# Patient Record
Sex: Female | Born: 2000 | Hispanic: No | Marital: Single | Smoking: Former smoker
Health system: Southern US, Community
[De-identification: ages and names within clinical notes are randomized; demographics above are authoritative.]

## PROBLEM LIST (undated history)

## (undated) DIAGNOSIS — G40909 Epilepsy, unspecified, not intractable, without status epilepticus: Secondary | ICD-10-CM

## (undated) DIAGNOSIS — Z789 Other specified health status: Secondary | ICD-10-CM

## (undated) HISTORY — PX: NO PAST SURGERIES: SHX2092

---
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
## 2022-01-11 ENCOUNTER — Encounter: Payer: Self-pay | Admitting: *Deleted

## 2022-01-12 ENCOUNTER — Other Ambulatory Visit: Payer: Self-pay | Admitting: Neurology

## 2022-01-17 ENCOUNTER — Telehealth: Payer: Self-pay

## 2022-01-17 NOTE — Telephone Encounter (Signed)
Patient's mother left a voicemail today at 12:08 PM asking for a call to discuss her daughter.  She left no further information.

## 2022-01-17 NOTE — Telephone Encounter (Signed)
Breakthrough seizure likely related to missed dose of medication. Will follow up on the 21

## 2022-01-17 NOTE — Telephone Encounter (Signed)
I called the patient's mother back. Left message to continue titration as planned and to be careful not to miss any doses. Keep pending appt 01/26/22. Provided our number to call back for any other questions.

## 2022-01-17 NOTE — Telephone Encounter (Signed)
I spoke to her mother. Reports a breakthrough seizure on 01/14/22. She is taking levetiracetam 500mg  BID. She is also titrating her lamotrigine dosage. He mother is unsure if she is at 50mg  BID or 75 BID. She will confirm then call us back. States she was sick with an upper respiratory infection last week and had several missed doses. Pending appt 01/26/22.  They will call back with the medication update.

## 2022-01-26 ENCOUNTER — Encounter: Payer: Self-pay | Admitting: Neurology

## 2022-01-26 ENCOUNTER — Ambulatory Visit: Payer: BC Managed Care – PPO | Admitting: Neurology

## 2022-01-26 VITALS — BP 110/70 | HR 66 | Ht 61.0 in | Wt 103.0 lb

## 2022-01-26 DIAGNOSIS — G40909 Epilepsy, unspecified, not intractable, without status epilepticus: Secondary | ICD-10-CM | POA: Diagnosis not present

## 2022-01-26 DIAGNOSIS — Z5181 Encounter for therapeutic drug level monitoring: Secondary | ICD-10-CM

## 2022-01-26 MED ORDER — LAMOTRIGINE 100 MG PO TABS: 100.0000 mg | ORAL_TABLET | Freq: Two times a day (BID) | ORAL | 6 refills | Status: DC

## 2022-01-26 NOTE — Patient Instructions (Addendum)
Increase lamotrigine to 100 mg twice daily We will obtain a lamotrigine level with a BMP Follow-up in 6 months or sooner if worse Contact me if you have a breakthrough seizure.

## 2022-01-26 NOTE — Progress Notes (Signed)
[facility]  PATIENT: Michelle Horn DOB: [birth year]  REQUESTING CLINICIAN: Carney Living, * HISTORY FROM: Patient and mother  REASON FOR VISIT: Generalized epilepsy.    HISTORICAL  CHIEF COMPLAINT:  Chief Complaint  Patient presents with   Follow-up    ROOM 15, with mother Taking Keppra 500 daily and Lamictal 25mg  tid, last breakthrough sz 6/9      INTERVAL HISTORY 01/26/2022:  Patient presents today for follow-up, she is accompanied by her mother.  At last visit, plan was to start lamotrigine and to switch patient from Keppra to lamotrigine due to side effect of Keppra.  She reported self discontinued the Keppra, currently is taking lamotrigine 75 mg twice daily.  She reported a breakthrough seizure on June 9 in the setting of missing her medication, she did not contact the office.  Otherwise no other complaint and no other concerns.    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  Patient reports her first seizure happened in fall 2022.  Mom describes her seizures as patient being unresponsive, eyes glazed and followed by a full generalized convulsion with foaming at the mouth and tongue biting.  There was no urinary incontinence.  She presented to the ED after her first seizure, was stable and patient referred to pediatric neurology.  She did follow-up with pediatric neurologist, had a EEG which was normal.  She had a second seizure in December 2022 and at that time she was started on Keppra but was not taking it consistently.  Patient report her last seizure was on October 28, 2021, at that time she was restarted on Keppra 500 mg twice daily.  Currently she is taking 500 mg daily, denies any additional seizures but report side effect of sleepiness, and mood swing.  She denies any family history of seizure, denies any seizure risk  factors. In terms of her headache she does report right-sided headache, couple times a week which photophobia, no nausea no vomiting.  States that Tylenol and ibuprofen helps with her headaches.     Handedness: Right handed   Onset: 11/2020  Seizure Type: Generalized convulsion   Current frequency: A total of 4 generalized seizure, the last seizure being in October 28, 2020  Any injuries from seizures: tongue biting   Seizure risk factors: None reported   Previous ASMs: Levetiracetam 500 mg   Currenty ASMs: Levetiracetam 500 mg daily   ASMs side effects: Sleepy and vomiting  Brain Images: Not done  Previous EEGs: Normal    OTHER MEDICAL CONDITIONS: Migraines   REVIEW OF SYSTEMS: Full 14 system review of systems performed and negative with exception of: as noted in the HPI   ALLERGIES: No Known Allergies  HOME MEDICATIONS: Outpatient Medications Prior to Visit  Medication Sig Dispense Refill   folic acid (FOLVITE) 1 MG tablet Take 1 tablet (1 mg total) by mouth daily. 90 tablet 3   levETIRAcetam (KEPPRA) 500 MG tablet Take 1 tablet (500 mg total) by mouth daily. 30 tablet 1   lamoTRIgine (LAMICTAL) 25 MG tablet WEEK 1: 25MG  (ONE PILL) AT NIGHT , WEEK 2: 25MG  (ONE PILL) TWICE DAILY, WEEK 3: 25MG  (ONE PILL) MORNING AND 50MG  (TWO PILLS) NIGHT, WEEK 4: 50MG  (TWO PILLS) TWICE DAILY, WEEK 5: 75MG  (THREE PILLS) TWICE DAILY, WEEK 6: 100MG  (FOURS PILLS) TWICE DAILY 240 tablet 0   No facility-administered medications prior to visit.  PAST MEDICAL HISTORY: Past Medical History:  Diagnosis Date   COVID-19 affecting pregnancy, antepartum 08/24/2020   Group beta Strep positive 11/17/2020   Iron deficiency anemia of pregnancy 10/23/2020   Medical history non-contributory    Normal labor 11/17/2020   Supervision of normal first teen pregnancy 08/24/2020    PAST SURGICAL HISTORY: Past Surgical History:  Procedure Laterality Date   NO PAST SURGERIES      FAMILY HISTORY: Family  History  Problem Relation Age of Onset   Anxiety disorder Mother    Migraines Sister    Anxiety disorder Sister    Migraines Sister    Migraines Maternal Grandmother    Anxiety disorder Maternal Grandfather     SOCIAL HISTORY: Social History   Socioeconomic History   Marital status: Single    Spouse name: Not on file   Number of children: Not on file   Years of education: Not on file   Highest education level: Not on file  Occupational History   Not on file  Tobacco Use   Smoking status: Every Day    Types: Cigars    Passive exposure: Never   Smokeless tobacco: Never  Substance and Sexual Activity   Alcohol use: Never   Drug use: Never   Sexual activity: Yes    Birth control/protection: None  Other Topics Concern   Not on file  Social History Narrative   Michelle Horn is 21 years old.   Work in [employer].   Considering college when baby is older to communicate   Social Determinants of Health   Financial Resource Strain: Not on file  Food Insecurity: Not on file  Transportation Needs: Not on file  Physical Activity: Not on file  Stress: Not on file  Social Connections: Not on file  Intimate Partner Violence: Not on file    PHYSICAL EXAM  GENERAL EXAM/CONSTITUTIONAL: Vitals:  Vitals:   01/26/22 1452  BP: 110/70  Pulse: 66  Weight: 103 lb (46.7 kg)  Height: 5\' 1"  (1.549 m)   Body mass index is 19.46 kg/m. Wt Readings from Last 3 Encounters:  01/26/22 103 lb (46.7 kg) (7 %, Z= -1.44)*  12/15/21 103 lb (46.7 kg) (8 %, Z= -1.43)*  11/30/21 103 lb (46.7 kg) (8 %, Z= -1.42)*   * Growth percentiles are based on CDC (Girls, 2-20 Years) data.   Patient is in no distress; well developed, nourished and groomed; neck is supple  EYES: Pupils round and reactive to light, Visual fields full to confrontation, Extraocular movements intacts,  No results found.  MUSCULOSKELETAL: Gait, strength, tone, movements noted in Neurologic exam below  NEUROLOGIC: MENTAL  STATUS:      No data to display         awake, alert, oriented to person, place and time recent and remote memory intact normal attention and concentration language fluent, comprehension intact, naming intact fund of knowledge appropriate  CRANIAL NERVE:  2nd, 3rd, 4th, 6th - pupils equal and reactive to light, visual fields full to confrontation, extraocular muscles intact, no nystagmus 5th - facial sensation symmetric 7th - facial strength symmetric 8th - hearing intact 9th - palate elevates symmetrically, uvula midline 11th - shoulder shrug symmetric 12th - tongue protrusion midline  MOTOR:  normal bulk and tone, full strength in the BUE, BLE  SENSORY:  normal and symmetric to light touch, pinprick, temperature, vibration  COORDINATION:  finger-nose-finger, fine finger movements normal  REFLEXES:  deep tendon reflexes present and symmetric  GAIT/STATION:  normal  DIAGNOSTIC DATA (LABS, IMAGING, TESTING) - I reviewed patient records, labs, notes, testing and imaging myself where available.  Lab Results  Component Value Date   WBC 12.3 06/12/2021   HGB 12.5 06/12/2021   HCT 36.3 06/12/2021   MCV 88.5 06/12/2021   PLT 350 06/12/2021      Component Value Date/Time   NA 140 06/12/2021 2030   NA 142 02/25/2020 0941   K 3.8 06/12/2021 2030   CL 109 06/12/2021 2030   CO2 21 (L) 06/12/2021 2030   GLUCOSE 87 06/12/2021 2030   BUN 7 06/12/2021 2030   BUN 14 02/25/2020 0941   CREATININE 0.74 06/12/2021 2030   CALCIUM 9.7 06/12/2021 2030   PROT 7.2 06/12/2021 2030   PROT 7.1 02/25/2020 0941   ALBUMIN 4.6 06/12/2021 2030   ALBUMIN 4.8 02/25/2020 0941   AST 18 06/12/2021 2030   ALT 14 06/12/2021 2030   ALKPHOS 57 06/12/2021 2030   BILITOT 1.0 06/12/2021 2030   BILITOT <0.2 02/25/2020 0941   GFRNONAA NOT CALCULATED 06/12/2021 2030   GFRAA CANCELED 02/25/2020 0941   No results found for: "CHOL", "HDL", "LDLCALC", "LDLDIRECT", "TRIG" No results found for:  "HGBA1C" No results found for: "VITAMINB12" Lab Results  Component Value Date   TSH 0.833 02/25/2020    Ambulatory EEG 08/2021 This prolonged ambulatory video EEG for 45 hours is normal with no epileptiform discharges or seizure activity.  There were no transient rhythmic activities or electrographic seizures noted.  Although there were frequent muscle artifact as well as electrode artifacts noted throughout the recording particularly the second part of the recording. There were no clinical seizure activity or pushbutton events noted or reported.  Background activity was normal.  Please note that a normal EEG does not exclude epilepsy, clinical correlation is indicated.   MRI Brain 12/29/2021:  Unremarkable MRI scan of the brain with and without contrast   ASSESSMENT AND PLAN  21 y.o. year old female  with migraine headache and seizure disorder who is presenting for follow up for her seizures.  Patient had a total of 4 generalized tonic-clonic seizures, her last one being on January 14 2022 in the setting of missing her medication.  Since she already self discontinued her Keppra, I will advise patient to go ahead and increase the lamotrigine to 100 mg twice daily.  I will obtain a lamotrigine level today with a BMP.  Advised her to contact me if she has a breakthrough seizure otherwise I will see her in 3 months for follow-up.  She reports she is comfortable with plans.  Discussed driving restriction for the next 6 months.    1. Seizure disorder (HCC)   2. Therapeutic drug monitoring      Patient Instructions  Increase lamotrigine to 100 mg twice daily We will obtain a lamotrigine level with a BMP Follow-up in 6 months or sooner if worse Contact me if you have a breakthrough seizure.   Per [facility] statutes, patients with seizures are not allowed to drive until they have been seizure-free for six months.  Other recommendations include using caution when using heavy equipment or  power tools. Avoid working on ladders or at heights. Take showers instead of baths.  Do not swim alone.  Ensure the water temperature is not too high on the home water heater. Do not go swimming alone. Do not lock yourself in a room alone (i.e. bathroom). When caring for infants or small children, sit down when holding, feeding, or changing  them to minimize risk of injury to the child in the event you have a seizure. Maintain good sleep hygiene. Avoid alcohol.  Also recommend adequate sleep, hydration, good diet and minimize stress.   During the Seizure  - First, ensure adequate ventilation and place patients on the floor on their left side  Loosen clothing around the neck and ensure the airway is patent. If the patient is clenching the teeth, do not force the mouth open with any object as this can cause severe damage - Remove all items from the surrounding that can be hazardous. The patient may be oblivious to what's happening and may not even know what he or she is doing. If the patient is confused and wandering, either gently guide him/her away and block access to outside areas - Reassure the individual and be comforting - Call 911. In most cases, the seizure ends before EMS arrives. However, there are cases when seizures may last over 3 to 5 minutes. Or the individual may have developed breathing difficulties or severe injuries. If a pregnant patient or a person with diabetes develops a seizure, it is prudent to call an ambulance. - Finally, if the patient does not regain full consciousness, then call EMS. Most patients will remain confused for about 45 to 90 minutes after a seizure, so you must use judgment in calling for help. - Avoid restraints but make sure the patient is in a bed with padded side rails - Place the individual in a lateral position with the neck slightly flexed; this will help the saliva drain from the mouth and prevent the tongue from falling backward - Remove all nearby  furniture and other hazards from the area - Provide verbal assurance as the individual is regaining consciousness - Provide the patient with privacy if possible - Call for help and start treatment as ordered by the caregiver   After the Seizure (Postictal Stage)  After a seizure, most patients experience confusion, fatigue, muscle pain and/or a headache. Thus, one should permit the individual to sleep. For the next few days, reassurance is essential. Being calm and helping reorient the person is also of importance.  Most seizures are painless and end spontaneously. Seizures are not harmful to others but can lead to complications such as stress on the lungs, brain and the heart. Individuals with prior lung problems may develop labored breathing and respiratory distress.     Orders Placed This Encounter  Procedures   Lamotrigine level   Basic Metabolic Panel    Meds ordered this encounter  Medications   lamoTRIgine (LAMICTAL) 100 MG tablet    Sig: Take 1 tablet (100 mg total) by mouth 2 (two) times daily.    Dispense:  60 tablet    Refill:  6    Return in about 6 months (around 07/28/2022).    Windell Norfolk, MD 01/26/2022, 5:09 PM  [facility] [address], [address] [place], [place] [address] [phone]

## 2022-01-28 LAB — LAMOTRIGINE LEVEL: Lamotrigine Lvl: 3.1 ug/mL (ref 2.0–20.0)

## 2022-01-28 LAB — BASIC METABOLIC PANEL
BUN/Creatinine Ratio: 15 (ref 9–23)
BUN: 13 mg/dL (ref 6–20)
CO2: 25 mmol/L (ref 20–29)
Calcium: 10.5 mg/dL — ABNORMAL HIGH (ref 8.7–10.2)
Chloride: 103 mmol/L (ref 96–106)
Creatinine, Ser: 0.84 mg/dL (ref 0.57–1.00)
Glucose: 83 mg/dL (ref 70–99)
Potassium: 4.5 mmol/L (ref 3.5–5.2)
Sodium: 143 mmol/L (ref 134–144)
eGFR: 103 mL/min/{1.73_m2} (ref >59)

## 2022-02-07 ENCOUNTER — Encounter: Payer: Self-pay | Admitting: Family Medicine

## 2022-02-07 ENCOUNTER — Ambulatory Visit (INDEPENDENT_AMBULATORY_CARE_PROVIDER_SITE_OTHER): Payer: BC Managed Care – PPO | Admitting: Family Medicine

## 2022-02-07 ENCOUNTER — Other Ambulatory Visit (HOSPITAL_COMMUNITY)
Admission: RE | Admit: 2022-02-07 | Discharge: 2022-02-07 | Disposition: A | Discharge status: Home / Self Care | Payer: BC Managed Care – PPO | Source: Ambulatory Visit | Attending: Family Medicine | Admitting: Family Medicine

## 2022-02-07 VITALS — BP 100/60 | HR 95 | Temp 98.8°F | Wt 101.6 lb

## 2022-02-07 DIAGNOSIS — N898 Other specified noninflammatory disorders of vagina: Secondary | ICD-10-CM | POA: Diagnosis not present

## 2022-02-07 DIAGNOSIS — R300 Dysuria: Secondary | ICD-10-CM

## 2022-02-07 DIAGNOSIS — N760 Acute vaginitis: Secondary | ICD-10-CM | POA: Diagnosis not present

## 2022-02-07 DIAGNOSIS — R102 Pelvic and perineal pain: Secondary | ICD-10-CM | POA: Diagnosis not present

## 2022-02-07 LAB — POCT URINALYSIS DIP (MANUAL ENTRY)
Glucose, UA: NEGATIVE mg/dL
Nitrite, UA: NEGATIVE
Protein Ur, POC: >=300 mg/dL — AB
Spec Grav, UA: >=1.03 — AB (ref 1.010–1.025)
Urobilinogen, UA: 0.2 E.U./dL
pH, UA: 6.5 (ref 5.0–8.0)

## 2022-02-07 MED ORDER — ACYCLOVIR 800 MG PO TABS: 800.0000 mg | ORAL_TABLET | Freq: Two times a day (BID) | ORAL | 0 refills | Status: AC

## 2022-02-07 NOTE — Assessment & Plan Note (Signed)
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

## 2022-02-07 NOTE — Progress Notes (Signed)
    SUBJECTIVE:   CHIEF COMPLAINT / HPI: dysuria and vaginal discharge  Patient reports that after having intercourse 4 days ago, three days ago she began to have pelvic pain and dysuria  She denies pain with intercourse at the time She reports not using barrier contraception  Vaginal discharge is white/yellow  She denies pruritis  She denies lower abdominal pain  She denies fevers  She denies back pain  Patient reports burning with urination  Patient reports that last sexual encounter was rougher than normal  PERTINENT  PMH / PSH:  Depression    OBJECTIVE:   BP 100/60   Pulse 95   Temp 98.8 F (37.1 C)   Wt 101 lb 9.6 oz (46.1 kg)   SpO2 99%   BMI 19.20 kg/m   Physical Exam Abdominal:     General: Bowel sounds are normal.     Palpations: Abdomen is soft. There is no hepatomegaly, splenomegaly, mass or pulsatile mass.     Tenderness: There is no abdominal tenderness. There is no right CVA tenderness or left CVA tenderness.  Genitourinary:    Labia:        Right: Tenderness and lesion present.        Left: Tenderness and lesion present.         ASSESSMENT/PLAN:   Vaginal lesion Bilateral, tender ulcerative lesions on labia minora suspicious for HSV Collected viral swab  Given patient's tenderness recommended trial of acyclovir for 5 day course 800mg  BID  Also recommended dermoplast spray to help with irritation with urinating   Unable to complete internal pelvic exam due to patient's inability to tolerate speculum insertion, collected aptima swab with blind insertion to avoid irritating areas of lesions      , MD [facility]

## 2022-02-07 NOTE — Patient Instructions (Addendum)
  We will test for any infections or changes to your vaginal pH that may be causing the discharge as well as check for yeast.   We will collect swabs to test for sexually transmitted infections. I will notify you of any abnormal results.   I have prescribed a medication to help with the sore areas we discussed today. Please take this twice daily for 5 days.

## 2022-02-10 ENCOUNTER — Telehealth: Payer: Self-pay

## 2022-02-10 LAB — CERVICOVAGINAL ANCILLARY ONLY
Bacterial Vaginitis (gardnerella): POSITIVE — AB
Candida Glabrata: NEGATIVE
Candida Vaginitis: NEGATIVE
Chlamydia: NEGATIVE
Comment: NEGATIVE
Comment: NEGATIVE
Comment: NEGATIVE
Comment: NEGATIVE
Comment: NEGATIVE
Comment: NORMAL
Neisseria Gonorrhea: NEGATIVE
Trichomonas: NEGATIVE

## 2022-02-10 LAB — HERPES SIMPLEX VIRUS CULTURE

## 2022-02-10 MED ORDER — METRONIDAZOLE 500 MG PO TABS: 500.0000 mg | ORAL_TABLET | Freq: Three times a day (TID) | ORAL | 0 refills | Status: DC

## 2022-02-10 NOTE — Telephone Encounter (Signed)
Patient and mother call nurse line requesting results from recent visit.   Mother reports her symptoms have improved. Denies any continued dysuria. Patient has been taking acyclovir, however herpes simplex was negative.   Mother advised of GC/Chlamydia still pending.   Mother would like to know the results of urine.   Will forward to provider who saw patient.

## 2022-02-10 NOTE — Addendum Note (Signed)
Addended by: Bing Neighbors on: 02/10/2022 01:34 PM   Modules accepted: Orders

## 2022-02-11 NOTE — Telephone Encounter (Signed)
Please see result note.  Called to discuss results with patient's mother and patient.

## 2022-02-18 ENCOUNTER — Other Ambulatory Visit: Payer: Self-pay | Admitting: Neurology

## 2022-03-27 ENCOUNTER — Ambulatory Visit (HOSPITAL_COMMUNITY)
Admission: EM | Admit: 2022-03-27 | Discharge: 2022-03-27 | Disposition: A | Discharge status: Home / Self Care | Payer: BC Managed Care – PPO | Attending: Physician Assistant | Admitting: Physician Assistant

## 2022-03-27 ENCOUNTER — Encounter (HOSPITAL_COMMUNITY): Payer: Self-pay | Admitting: Emergency Medicine

## 2022-03-27 DIAGNOSIS — J069 Acute upper respiratory infection, unspecified: Secondary | ICD-10-CM | POA: Insufficient documentation

## 2022-03-27 DIAGNOSIS — R112 Nausea with vomiting, unspecified: Secondary | ICD-10-CM | POA: Diagnosis not present

## 2022-03-27 DIAGNOSIS — R509 Fever, unspecified: Secondary | ICD-10-CM | POA: Diagnosis not present

## 2022-03-27 DIAGNOSIS — R300 Dysuria: Secondary | ICD-10-CM | POA: Diagnosis not present

## 2022-03-27 DIAGNOSIS — Z20822 Contact with and (suspected) exposure to covid-19: Secondary | ICD-10-CM | POA: Insufficient documentation

## 2022-03-27 DIAGNOSIS — Z79899 Other long term (current) drug therapy: Secondary | ICD-10-CM | POA: Diagnosis not present

## 2022-03-27 HISTORY — DX: Epilepsy, unspecified, not intractable, without status epilepticus: G40.909

## 2022-03-27 LAB — POC URINE PREG, ED: Preg Test, Ur: NEGATIVE

## 2022-03-27 LAB — CBC WITH DIFFERENTIAL/PLATELET
Abs Immature Granulocytes: 0.11 10*3/uL — ABNORMAL HIGH (ref 0.00–0.07)
Basophils Absolute: 0.1 10*3/uL (ref 0.0–0.1)
Basophils Relative: 0 %
Eosinophils Absolute: 0.5 10*3/uL (ref 0.0–0.5)
Eosinophils Relative: 3 %
HCT: 41.4 % (ref 36.0–46.0)
Hemoglobin: 14 g/dL (ref 12.0–15.0)
Immature Granulocytes: 1 %
Lymphocytes Relative: 6 %
Lymphs Abs: 1 10*3/uL (ref 0.7–4.0)
MCH: 29.4 pg (ref 26.0–34.0)
MCHC: 33.8 g/dL (ref 30.0–36.0)
MCV: 87 fL (ref 80.0–100.0)
Monocytes Absolute: 2.3 10*3/uL — ABNORMAL HIGH (ref 0.1–1.0)
Monocytes Relative: 12 %
Neutro Abs: 14.7 10*3/uL — ABNORMAL HIGH (ref 1.7–7.7)
Neutrophils Relative %: 78 %
Platelets: 289 10*3/uL (ref 150–400)
RBC: 4.76 MIL/uL (ref 3.87–5.11)
RDW: 13.5 % (ref 11.5–15.5)
WBC: 18.6 10*3/uL — ABNORMAL HIGH (ref 4.0–10.5)
nRBC: 0 % (ref 0.0–0.2)

## 2022-03-27 LAB — POCT URINALYSIS DIPSTICK, ED / UC
Bilirubin Urine: NEGATIVE
Glucose, UA: NEGATIVE mg/dL
Ketones, ur: 15 mg/dL — AB
Nitrite: NEGATIVE
Protein, ur: >=300 mg/dL — AB
Specific Gravity, Urine: 1.015 (ref 1.005–1.030)
Urobilinogen, UA: 2 mg/dL — ABNORMAL HIGH (ref 0.0–1.0)
pH: 6.5 (ref 5.0–8.0)

## 2022-03-27 LAB — RESP PANEL BY RT-PCR (FLU A&B, COVID) ARPGX2
Influenza A by PCR: NEGATIVE
Influenza B by PCR: NEGATIVE
SARS Coronavirus 2 by RT PCR: NEGATIVE

## 2022-03-27 MED ORDER — ACETAMINOPHEN 325 MG PO TABS
650.0000 mg | ORAL_TABLET | Freq: Once | ORAL | Status: AC
  Administered 2022-03-27: 650 mg via ORAL

## 2022-03-27 MED ORDER — NITROFURANTOIN MONOHYD MACRO 100 MG PO CAPS: 100.0000 mg | ORAL_CAPSULE | Freq: Two times a day (BID) | ORAL | 0 refills | Status: DC

## 2022-03-27 MED ORDER — ACETAMINOPHEN 325 MG PO TABS
ORAL_TABLET | ORAL | Status: AC
  Filled 2022-03-27: qty 2

## 2022-03-27 MED ORDER — ONDANSETRON 4 MG PO TBDP
ORAL_TABLET | ORAL | Status: AC
  Filled 2022-03-27: qty 1

## 2022-03-27 MED ORDER — ONDANSETRON HCL 4 MG PO TABS: 4.0000 mg | ORAL_TABLET | Freq: Three times a day (TID) | ORAL | 0 refills | Status: DC | PRN

## 2022-03-27 MED ORDER — ONDANSETRON 4 MG PO TBDP
4.0000 mg | ORAL_TABLET | Freq: Once | ORAL | Status: AC
  Administered 2022-03-27: 4 mg via ORAL

## 2022-03-27 NOTE — Discharge Instructions (Addendum)
Advised to take the Zofran 1 tablet every 6 hours to control the abdominal cramping nausea and vomiting. Advised to increase fluid intake-clear liquids-Sprite-ginger ale-7-Up-10K-Gatorade.  Advised to then move into a bland diet over the next 48 hours if tolerated. Lab test for COVID and flu should be completed in the next 48 hours.  If this office does not call then consider these test negative.  You can go on MyChart to view the test results that 24 to 48 hours. Advised to report to the emergency room if symptoms fail to improve in the next 48 hours.

## 2022-03-27 NOTE — ED Provider Notes (Signed)
[facility]    CSN: [id number] Arrival date & time: 03/27/22  1026      History   Chief Complaint Chief Complaint  Patient presents with   Emesis   Fever   Generalized Body Aches    HPI Michelle Horn is a 21 y.o. female.   21 year old female presents with nausea and vomiting.  Patient indicates that since Thursday she has been having fever, 100-101.  She also has chills, body aches and pain, mild upper respiratory symptoms of rhinitis and congestion.  She also indicates that Thursday she started having stomach cramping, nausea and vomiting, without diarrhea.  Patient also indicates that on Tuesday and Wednesday she started having frequency, urgency and intermittent dysuria.  Patient also indicates that she is having some lower back pain associated with her symptoms.  She indicates she is tolerating fluids, and that the last time she urinated was this morning and it was light yellow.  Patient indicates that she has not been around any family or friends that have been sick with similar type symptoms.  Patient has not traveled recently.   Emesis Associated symptoms: fever   Fever Associated symptoms: nausea, rhinorrhea and vomiting     Past Medical History:  Diagnosis Date   COVID-19 affecting pregnancy, antepartum 08/24/2020   Epilepsy (HCC)    Group beta Strep positive 11/17/2020   Iron deficiency anemia of pregnancy 10/23/2020   Medical history non-contributory    Normal labor 11/17/2020   Supervision of normal first teen pregnancy 08/24/2020    Patient Active Problem List   Diagnosis Date Noted   Vaginal lesion 02/07/2022   Encounter for initial prescription of contraceptives 08/03/2021   First time seizure (HCC) 07/14/2021   Breakthrough bleeding on Depo-Provera 03/10/2021   Marijuana use 09/15/2020   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 Authorizing Provider  nitrofurantoin, macrocrystal-monohydrate, (MACROBID) 100 MG capsule Take 1 capsule (100 mg total) by mouth 2 (two) times daily. 03/27/22  Yes Ellsworth Lennox, PA-C  ondansetron (ZOFRAN) 4 MG tablet Take 1 tablet (4 mg total) by mouth every 8 (eight) hours as needed for nausea or vomiting. 03/27/22  Yes Ellsworth Lennox, PA-C  folic acid (FOLVITE) 1 MG tablet Take 1 tablet (1 mg total) by mouth daily. 12/15/21 12/10/22  Windell Norfolk, MD  lamoTRIgine (LAMICTAL) 100 MG tablet Take 1 tablet (100 mg total) by mouth 2 (two) times daily. 01/26/22 08/24/22  Windell Norfolk, MD  levETIRAcetam (KEPPRA) 500 MG tablet Take 1 tablet (500 mg total) by mouth daily. 12/15/21 02/13/22  Windell Norfolk, MD  metroNIDAZOLE (FLAGYL) 500 MG tablet Take 1 tablet (500 mg total) by mouth 3 (three) times daily. 02/10/22   Simmons-Robinson, Tawanna Cooler, MD    Family History Family History  Problem Relation Age of Onset   Anxiety disorder Mother  Migraines Sister    Anxiety disorder Sister    Migraines Sister    Migraines Maternal Grandmother    Anxiety disorder Maternal Grandfather     Social History Social History   Tobacco Use   Smoking status: Every Day    Types: Cigars    Passive exposure: Never   Smokeless tobacco: Never  Substance Use Topics   Alcohol use: Never   Drug use: Never     Allergies   Patient has no known allergies.   Review of Systems Review of Systems  Constitutional:  Positive for fatigue and fever.  HENT:  Positive for rhinorrhea.   Gastrointestinal:  Positive for nausea and vomiting.     Physical Exam Triage Vital Signs ED Triage Vitals  Enc Vitals Group     BP 03/27/22 1108 119/83     Pulse Rate  03/27/22 1108 (!) 110     Resp 03/27/22 1108 15     Temp 03/27/22 1108 (!) 101.3 F (38.5 C)     Temp Source 03/27/22 1108 Oral     SpO2 03/27/22 1108 98 %     Weight --      Height --      Head Circumference --      Peak Flow --      Pain Score 03/27/22 1102 8     Pain Loc --      Pain Edu? --      Excl. in GC? --    No data found.  Updated Vital Signs BP 119/83 (BP Location: Left Arm)   Pulse (!) 110   Temp (!) 101.3 F (38.5 C) (Oral)   Resp 15   SpO2 98%   Visual Acuity Right Eye Distance:   Left Eye Distance:   Bilateral Distance:    Right Eye Near:   Left Eye Near:    Bilateral Near:     Physical Exam Constitutional:      Appearance: Normal appearance.  HENT:     Right Ear: Tympanic membrane normal.     Left Ear: Tympanic membrane normal.     Mouth/Throat:     Mouth: Mucous membranes are moist.     Pharynx: Oropharynx is clear. No pharyngeal swelling.  Cardiovascular:     Rate and Rhythm: Normal rate and regular rhythm.     Heart sounds: Normal heart sounds.  Pulmonary:     Effort: Pulmonary effort is normal.     Breath sounds: Normal breath sounds and air entry. No wheezing, rhonchi or rales.  Abdominal:     General: Abdomen is flat. Bowel sounds are normal.     Palpations: Abdomen is soft.     Tenderness: There is generalized abdominal tenderness. There is no right CVA tenderness, left CVA tenderness, guarding or rebound.     Comments: Back: No CVA tenderness present bilaterally however there is some mild mid lower back tenderness on palpation.  Lymphadenopathy:     Cervical: No cervical adenopathy.  Neurological:     Mental Status: She is alert.      UC Treatments / Results  Labs (all labs ordered are listed, but only abnormal results are displayed) Labs Reviewed  POCT URINALYSIS DIPSTICK, ED / UC - Abnormal; Notable for the following components:      Result Value   Ketones, ur 15 (*)    Hgb urine dipstick LARGE (*)    Protein, ur >=300  (*)    Urobilinogen, UA 2.0 (*)    Leukocytes,Ua MODERATE (*)  All other components within normal limits  RESP PANEL BY RT-PCR (FLU A&B, COVID) ARPGX2  URINE CULTURE  CBC WITH DIFFERENTIAL/PLATELET  POC URINE PREG, ED    EKG   Radiology No results found.  Procedures Procedures (including critical care time)  Medications Ordered in UC Medications  ondansetron (ZOFRAN-ODT) disintegrating tablet 4 mg (4 mg Oral Given 03/27/22 1147)  acetaminophen (TYLENOL) tablet 650 mg (650 mg Oral Given 03/27/22 1205)    Initial Impression / Assessment and Plan / UC Course  I have reviewed the triage vital signs and the nursing notes.  Pertinent labs & imaging results that were available during my care of the patient were reviewed by me and considered in my medical decision making (see chart for details).    Plan: 1.  COVID and flu test are pending. 2.  CBC is pending. 3.  Advised patient to increase her fluid intake and to take the Zofran 1 every 6 hours in order to decrease the stomach cramping and vomiting. 4.  Urine culture is pending. 5.  Advised to take the Macrobid 1 every 12 hours until completed. 6.  Advised to report to the emergency room if symptoms fail to improve within the next 48 to 72 hours. Final Clinical Impressions(s) / UC Diagnoses   Final diagnoses:  Fever, unspecified  Nausea and vomiting, unspecified vomiting type  Dysuria  Viral upper respiratory tract infection     Discharge Instructions      Advised to take the Zofran 1 tablet every 6 hours to control the abdominal cramping nausea and vomiting. Advised to increase fluid intake-clear liquids-Sprite-ginger ale-7-Up-10K-Gatorade.  Advised to then move into a bland diet over the next 48 hours if tolerated. Lab test for COVID and flu should be completed in the next 48 hours.  If this office does not call then consider these test negative.  You can go on MyChart to view the test results that 24 to 48  hours. Advised to report to the emergency room if symptoms fail to improve in the next 48 hours.    ED Prescriptions     Medication Sig Dispense Auth. Provider   ondansetron (ZOFRAN) 4 MG tablet Take 1 tablet (4 mg total) by mouth every 8 (eight) hours as needed for nausea or vomiting. 20 tablet Ellsworth Lennox, PA-C   nitrofurantoin, macrocrystal-monohydrate, (MACROBID) 100 MG capsule Take 1 capsule (100 mg total) by mouth 2 (two) times daily. 10 capsule Ellsworth Lennox, PA-C      PDMP not reviewed this encounter.   Ellsworth Lennox, PA-C 03/27/22 1211

## 2022-03-27 NOTE — ED Triage Notes (Signed)
Since Thursday having vomiting, body aches, fevers. Dramamine, ibuprofen

## 2022-03-29 ENCOUNTER — Telehealth: Payer: Self-pay

## 2022-03-29 LAB — URINE CULTURE: Culture: >=100000 — AB

## 2022-03-29 NOTE — Telephone Encounter (Signed)
Mother calls nurse line reporting continued fevers and vomiting.   Mother reports she went to UC on Sunday and was given Macrobid and Zofran, however no relief.   Mother reports most recent temp was ~2am this morning and 102.7. Mother reports they have been alternating Tylenol and Motrin. Mother reports she can not keep anything down and she is very weak.   Mother advised to take her to ED for evaluation.   Mother agreed with plan.

## 2022-04-29 ENCOUNTER — Other Ambulatory Visit: Payer: Self-pay | Admitting: Neurology

## 2022-07-24 ENCOUNTER — Other Ambulatory Visit: Payer: Self-pay | Admitting: Neurology

## 2022-07-28 ENCOUNTER — Ambulatory Visit: Payer: BC Managed Care – PPO | Admitting: Neurology

## 2022-10-07 ENCOUNTER — Emergency Department (HOSPITAL_COMMUNITY)
Admission: EM | Admit: 2022-10-07 | Discharge: 2022-10-07 | Disposition: A | Discharge status: Home / Self Care | Payer: BC Managed Care – PPO | Attending: Emergency Medicine | Admitting: Emergency Medicine

## 2022-10-07 ENCOUNTER — Other Ambulatory Visit: Payer: Self-pay

## 2022-10-07 DIAGNOSIS — R569 Unspecified convulsions: Secondary | ICD-10-CM

## 2022-10-07 DIAGNOSIS — G40909 Epilepsy, unspecified, not intractable, without status epilepticus: Secondary | ICD-10-CM | POA: Diagnosis not present

## 2022-10-07 DIAGNOSIS — R1111 Vomiting without nausea: Secondary | ICD-10-CM | POA: Diagnosis not present

## 2022-10-07 DIAGNOSIS — R519 Headache, unspecified: Secondary | ICD-10-CM | POA: Insufficient documentation

## 2022-10-07 DIAGNOSIS — R456 Violent behavior: Secondary | ICD-10-CM | POA: Diagnosis not present

## 2022-10-07 DIAGNOSIS — I959 Hypotension, unspecified: Secondary | ICD-10-CM | POA: Diagnosis not present

## 2022-10-07 LAB — CBC
HCT: 48.5 % — ABNORMAL HIGH (ref 36.0–46.0)
Hemoglobin: 15.8 g/dL — ABNORMAL HIGH (ref 12.0–15.0)
MCH: 29.1 pg (ref 26.0–34.0)
MCHC: 32.6 g/dL (ref 30.0–36.0)
MCV: 89.3 fL (ref 80.0–100.0)
Platelets: 378 10*3/uL (ref 150–400)
RBC: 5.43 MIL/uL — ABNORMAL HIGH (ref 3.87–5.11)
RDW: 14.3 % (ref 11.5–15.5)
WBC: 12.2 10*3/uL — ABNORMAL HIGH (ref 4.0–10.5)
nRBC: 0 % (ref 0.0–0.2)

## 2022-10-07 LAB — BASIC METABOLIC PANEL
Anion gap: 8 (ref 5–15)
BUN: 18 mg/dL (ref 6–20)
CO2: 25 mmol/L (ref 22–32)
Calcium: 9.6 mg/dL (ref 8.9–10.3)
Chloride: 104 mmol/L (ref 98–111)
Creatinine, Ser: 0.86 mg/dL (ref 0.44–1.00)
GFR, Estimated: >60 mL/min (ref >60)
Glucose, Bld: 94 mg/dL (ref 70–99)
Potassium: 4 mmol/L (ref 3.5–5.1)
Sodium: 137 mmol/L (ref 135–145)

## 2022-10-07 LAB — I-STAT BETA HCG BLOOD, ED (MC, WL, AP ONLY): I-stat hCG, quantitative: <5 m[IU]/mL (ref <5)

## 2022-10-07 LAB — CBG MONITORING, ED: Glucose-Capillary: 111 mg/dL — ABNORMAL HIGH (ref 70–99)

## 2022-10-07 MED ORDER — ACETAMINOPHEN 325 MG PO TABS
650.0000 mg | ORAL_TABLET | Freq: Once | ORAL | Status: AC
  Administered 2022-10-07: 650 mg via ORAL
  Filled 2022-10-07: qty 2

## 2022-10-07 NOTE — Discharge Instructions (Addendum)
You were seen in the ER after a seizure.  Your lab work was normal. We got a level to look at how well your lamotrigine is doing, and this should result in the next day or so.   Please follow up with your neurologist as they will likely want to make adjustments to your medications.
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
## 2023-02-07 ENCOUNTER — Other Ambulatory Visit: Payer: Self-pay | Admitting: Neurology

## 2023-03-30 NOTE — Progress Notes (Signed)
[facility]  PATIENT: Michelle Horn DOB: 2000/12/18   PRIMARY NEUROLOGIST: Dr. Teresa Horn REQUESTING CLINICIAN: Deirdre Horn Michelle Horn, * HISTORY FROM: Patient and mother  REASON FOR VISIT: Generalized epilepsy.    HISTORICAL  CHIEF COMPLAINT:  Chief Complaint  Patient presents with   Follow-up    Patient in room #3 with her mother. Patient states she has had at ess three episode since the last time she was seen.   HPI:  Update 04/03/2023 JM: Patient returns for follow-up visit after prior visit with Dr. Teresa Horn over 1 year ago.  She is accompanied by her mother.  Reports 3 seizures this year, was seen in ED in March for typical type seizure, continue lamotrigine 100 mg twice daily.  Reports additional seizure in May, evaluated by EMS but did not proceed to ED, typical type seizure but postictal confusion lasted for about 1 hour. Reports seizure 2 weeks ago while on cruise, typical type seizure, "mild" per mother.  Denies any missed dosages of lamotrigine, denies any provoked factors around the time of seizures such as illness/infection, sleep deprivation, drastic change in diet, etc. Reports tolerating lamotrigine dosage well.    History provided for reference purposes only Update 01/26/2022 Dr. Teresa Horn:  Patient presents today for follow-up, she is accompanied by her mother.  At last visit, plan was to start lamotrigine and to switch patient from Keppra to lamotrigine due to side effect of Keppra.  She reported self discontinued the Keppra, currently is taking lamotrigine 75 mg twice daily.  She reported a breakthrough seizure on June 9 in the setting of missing her medication, she did not contact the office.  Otherwise no other complaint and no other concerns.  Consult visit 12/15/2021 Dr. Teresa Horn: This is a 22 year old woman past medical history of migraine headaches who is presenting to establish care for her generalized epilepsy disorder.  Patient reports her first  seizure happened in fall 2022.  Mom describes her seizures as patient being unresponsive, eyes glazed and followed by a full generalized convulsion with foaming at the mouth and tongue biting.  There was no urinary incontinence.  She presented to the ED after her first seizure, was stable and patient referred to pediatric neurology.  She did follow-up with pediatric neurologist, had a EEG which was normal.  She had a second seizure in December 2022 and at that time she was started on Keppra but was not taking it consistently.  Patient report her last seizure was on October 28, 2021, at that time she was restarted on Keppra 500 mg twice daily.  Currently she is taking 500 mg daily, denies any additional seizures but report side effect of sleepiness, and mood swing.  She denies any family history of seizure, denies any seizure risk factors. In terms of her headache she does report right-sided headache, couple times a week which photophobia, no nausea no vomiting.  States that Tylenol and ibuprofen helps with her headaches.     Handedness: Right handed   Onset: 11/2020  Seizure Type: Generalized convulsion   Current frequency: A total of 4 generalized seizure, the last seizure being in October 28, 2020  Any injuries from seizures: tongue biting   Seizure risk factors: None reported   Previous ASMs: Levetiracetam 500 mg   Currenty ASMs: Levetiracetam 500 mg daily   ASMs side effects: Sleepy and vomiting  Brain Images: Not done  Previous EEGs: Normal    OTHER MEDICAL CONDITIONS: Migraines   REVIEW OF SYSTEMS: Full 14 system  review of systems performed and negative with exception of: as noted in the HPI   ALLERGIES: No Known Allergies  HOME MEDICATIONS: Outpatient Medications Prior to Visit  Medication Sig Dispense Refill   ondansetron (ZOFRAN) 4 MG tablet Take 1 tablet (4 mg total) by mouth every 8 (eight) hours as needed for nausea or vomiting. 20 tablet 0   lamoTRIgine (LAMICTAL) 100  MG tablet TAKE 1 TABLET BY MOUTH TWICE A DAY 60 tablet 6   levETIRAcetam (KEPPRA) 500 MG tablet Take 1 tablet (500 mg total) by mouth daily. 30 tablet 1   metroNIDAZOLE (FLAGYL) 500 MG tablet Take 1 tablet (500 mg total) by mouth 3 (three) times daily. (Patient not taking: Reported on 04/03/2023) 21 tablet 0   nitrofurantoin, macrocrystal-monohydrate, (MACROBID) 100 MG capsule Take 1 capsule (100 mg total) by mouth 2 (two) times daily. (Patient not taking: Reported on 04/03/2023) 10 capsule 0   No facility-administered medications prior to visit.    PAST MEDICAL HISTORY: Past Medical History:  Diagnosis Date   COVID-19 affecting pregnancy, antepartum 08/24/2020   Epilepsy (HCC)    Group beta Strep positive 11/17/2020   Iron deficiency anemia of pregnancy 10/23/2020   Medical history non-contributory    Normal labor 11/17/2020   Supervision of normal first teen pregnancy 08/24/2020    PAST SURGICAL HISTORY: Past Surgical History:  Procedure Laterality Date   NO PAST SURGERIES      FAMILY HISTORY: Family History  Problem Relation Age of Onset   Anxiety disorder Mother    Migraines Sister    Anxiety disorder Sister    Migraines Sister    Migraines Maternal Grandmother    Anxiety disorder Maternal Grandfather     SOCIAL HISTORY: Social History   Socioeconomic History   Marital status: Single    Spouse name: Not on file   Number of children: Not on file   Years of education: Not on file   Highest education level: Not on file  Occupational History   Not on file  Tobacco Use   Smoking status: Every Day    Types: Cigars    Passive exposure: Never   Smokeless tobacco: Never  Substance and Sexual Activity   Alcohol use: Never   Drug use: Never   Sexual activity: Yes    Birth control/protection: None  Other Topics Concern   Not on file  Social History Narrative   Michelle Horn is 22 years old.   Work in [employer].   Considering college when baby is older to communicate    Social Determinants of Health   Financial Resource Strain: Not on file  Food Insecurity: Not on file  Transportation Needs: Not on file  Physical Activity: Not on file  Stress: Not on file  Social Connections: Not on file  Intimate Partner Violence: Not on file    PHYSICAL EXAM  GENERAL EXAM/CONSTITUTIONAL: Vitals:  Today's Vitals   04/03/23 0725  BP: 114/72  Pulse: 75  Weight: 130 lb 6.4 oz (59.1 kg)  Height: 5\' 2"  (1.575 m)   Body mass index is 23.85 kg/m.   Patient is in no distress; well developed, nourished and groomed; neck is supple  EYES: Pupils round and reactive to light, Visual fields full to confrontation, Extraocular movements intacts,  No results found.  MUSCULOSKELETAL: Gait, strength, tone, movements noted in Neurologic exam below  NEUROLOGIC: MENTAL STATUS:      No data to display         awake, alert, oriented to  person, place and time recent and remote memory intact normal attention and concentration language fluent, comprehension intact, naming intact fund of knowledge appropriate  CRANIAL NERVE:  2nd, 3rd, 4th, 6th - pupils equal and reactive to light, visual fields full to confrontation, extraocular muscles intact, no nystagmus 5th - facial sensation symmetric 7th - facial strength symmetric 8th - hearing intact 9th - palate elevates symmetrically, uvula midline 11th - shoulder shrug symmetric 12th - tongue protrusion midline  MOTOR:  normal bulk and tone, full strength in the BUE, BLE  SENSORY:  normal and symmetric to light touch, pinprick, temperature, vibration  COORDINATION:  finger-nose-finger, fine finger movements normal  REFLEXES:  deep tendon reflexes present and symmetric  GAIT/STATION:  normal   DIAGNOSTIC DATA (LABS, IMAGING, TESTING) - I reviewed patient records, labs, notes, testing and imaging myself where available.  Lab Results  Component Value Date   WBC 12.2 (H) 10/07/2022   HGB 15.8 (H)  10/07/2022   HCT 48.5 (H) 10/07/2022   MCV 89.3 10/07/2022   PLT 378 10/07/2022      Component Value Date/Time   NA 137 10/07/2022 1100   NA 143 01/26/2022 1515   K 4.0 10/07/2022 1100   CL 104 10/07/2022 1100   CO2 25 10/07/2022 1100   GLUCOSE 94 10/07/2022 1100   BUN 18 10/07/2022 1100   BUN 13 01/26/2022 1515   CREATININE 0.86 10/07/2022 1100   CALCIUM 9.6 10/07/2022 1100   PROT 7.2 06/12/2021 2030   PROT 7.1 02/25/2020 0941   ALBUMIN 4.6 06/12/2021 2030   ALBUMIN 4.8 02/25/2020 0941   AST 18 06/12/2021 2030   ALT 14 06/12/2021 2030   ALKPHOS 57 06/12/2021 2030   BILITOT 1.0 06/12/2021 2030   BILITOT <0.2 02/25/2020 0941   GFRNONAA >60 10/07/2022 1100   GFRAA CANCELED 02/25/2020 0941   No results found for: "CHOL", "HDL", "LDLCALC", "LDLDIRECT", "TRIG" No results found for: "HGBA1C" No results found for: "VITAMINB12" Lab Results  Component Value Date   TSH 0.833 02/25/2020    Ambulatory EEG 08/2021 This prolonged ambulatory video EEG for 45 hours is normal with no epileptiform discharges or seizure activity.  There were no transient rhythmic activities or electrographic seizures noted.  Although there were frequent muscle artifact as well as electrode artifacts noted throughout the recording particularly the second part of the recording. There were no clinical seizure activity or pushbutton events noted or reported.  Background activity was normal.  Please note that a normal EEG does not exclude epilepsy, clinical correlation is indicated.   MRI Brain 12/29/2021:  Unremarkable MRI scan of the brain with and without contrast     ASSESSMENT AND PLAN  22 y.o. year old female  with migraine headache and seizure disorder who is presenting for follow up for her seizures.  Patient had a total of 4 generalized tonic-clonic seizures, her last one being on January 14 2022 in the setting of missing her medication.  Patient self discontinued Keppra due to side effects, lamotrigine  dosage increased to 100 mg BID in 01/2022.  Reports 3 additional seizures since 08/2022 seemingly unprovoked and compliant on lamotrigine   1. Seizure disorder (HCC)   2. Therapeutic drug monitoring     -Increase lamotrigine to 150mg  BID -Repeat lamotrigine level with BMP today (prior level 3.1 01/2022) and CMP -no driving for 6 months post seizure activity per [place] law -Advised to contact office with any additional seizures -as breakthrough seizures typical type seizure activity, do not believe  repeat EEG or imaging needed at this time but will further discuss with Dr. Teresa Horn - will notify patient/mother via MyChart regarding his recommendations -Advised to notify office with any further seizure activity   Orders Placed This Encounter  Procedures   Lamotrigine level   CMP    Meds ordered this encounter  Medications   lamoTRIgine (LAMICTAL) 150 MG tablet    Sig: Take 1 tablet (150 mg total) by mouth 2 (two) times daily.    Dispense:  60 tablet    Refill:  5    Return in about 4 months (around 08/03/2023) for with Dr. Teresa Horn for seizure f/u .     I spent 25 minutes of face-to-face and non-face-to-face time with patient and mother.  This included previsit chart review, lab review, study review, order entry, electronic health record documentation, patient education and discussion regarding above diagnoses and treatment plan and answered all other questions to patient and mother's satisfaction  Ihor Austin, [facility]  [facility] [address] [place], [place] [address]  Phone [phone] Fax [phone] Note: This document was prepared with digital dictation and possible smart phrase technology. Any transcriptional errors that result from this process are unintentional.

## 2023-04-03 ENCOUNTER — Encounter: Payer: Self-pay | Admitting: Adult Health

## 2023-04-03 ENCOUNTER — Ambulatory Visit (INDEPENDENT_AMBULATORY_CARE_PROVIDER_SITE_OTHER): Payer: BC Managed Care – PPO | Admitting: Adult Health

## 2023-04-03 VITALS — BP 114/72 | HR 75 | Ht 62.0 in | Wt 130.4 lb

## 2023-04-03 DIAGNOSIS — G40909 Epilepsy, unspecified, not intractable, without status epilepticus: Secondary | ICD-10-CM

## 2023-04-03 DIAGNOSIS — Z5181 Encounter for therapeutic drug level monitoring: Secondary | ICD-10-CM | POA: Diagnosis not present

## 2023-04-03 MED ORDER — LAMOTRIGINE 150 MG PO TABS: 150.0000 mg | ORAL_TABLET | Freq: Two times a day (BID) | ORAL | 5 refills | Status: DC

## 2023-04-03 NOTE — Patient Instructions (Signed)
Recommend increase lamotrigine to 150mg  twice daily for seizure prevention  We will check lab work today  Will follow up with Dr. Teresa Coombs regarding need to further testing/evaluation      Follow up with Dr. Teresa Coombs in 4 months or call earlier if needed

## 2023-04-04 LAB — COMPREHENSIVE METABOLIC PANEL
ALT: 16 IU/L (ref 0–32)
AST: 20 IU/L (ref 0–40)
Albumin: 4.7 g/dL (ref 4.0–5.0)
Alkaline Phosphatase: 84 IU/L (ref 42–106)
BUN/Creatinine Ratio: 11 (ref 9–23)
BUN: 9 mg/dL (ref 6–20)
Bilirubin Total: 0.3 mg/dL (ref 0.0–1.2)
CO2: 23 mmol/L (ref 20–29)
Calcium: 9.8 mg/dL (ref 8.7–10.2)
Chloride: 102 mmol/L (ref 96–106)
Creatinine, Ser: 0.8 mg/dL (ref 0.57–1.00)
Globulin, Total: 2.3 g/dL (ref 1.5–4.5)
Glucose: 88 mg/dL (ref 70–99)
Potassium: 4.1 mmol/L (ref 3.5–5.2)
Sodium: 141 mmol/L (ref 134–144)
Total Protein: 7 g/dL (ref 6.0–8.5)
eGFR: 109 mL/min/{1.73_m2} (ref >59)

## 2023-04-04 LAB — LAMOTRIGINE LEVEL: Lamotrigine Lvl: 5.1 ug/mL (ref 2.0–20.0)

## 2023-04-05 ENCOUNTER — Telehealth: Payer: Self-pay | Admitting: Anesthesiology

## 2023-04-05 NOTE — Telephone Encounter (Signed)
-----   Message from Ihor Austin sent at 04/05/2023  7:15 AM EDT ----- (Unable to receive via MyChart) Please advise patient that recent lab work was satisfactory, please continue with current treatment plan as discussed at recent visit.  Thank you.

## 2023-04-11 NOTE — Telephone Encounter (Signed)
Left message for pt to return call regarding lab results.  If pt returns call please advise of message below.   **Per Ihor Austin: Your lab results came back satisfactory. Please continue with current treatment plan as discussed at recent visit.    Increase lamotrigine to 150mg  twice a day -no driving for 6 months post seizure activity per [place] law -Advised to contact office with any additional seizures -as breakthrough seizures typical type seizure activity, I do not believe a repeat EEG or imaging needed at this time but will further discuss with Dr. Teresa Coombs - will notify patient/mother via MyChart regarding his recommendations -Advised to notify office with any further seizure activity   Please let us know if you have any questions. Thank you**

## 2023-04-12 ENCOUNTER — Encounter: Payer: Self-pay | Admitting: Adult Health

## 2023-05-05 ENCOUNTER — Other Ambulatory Visit: Payer: Self-pay | Admitting: Adult Health

## 2023-08-15 ENCOUNTER — Ambulatory Visit: Payer: BC Managed Care – PPO | Admitting: Neurology

## 2023-08-24 ENCOUNTER — Encounter: Payer: Self-pay | Admitting: Neurology

## 2023-08-24 ENCOUNTER — Ambulatory Visit: Payer: BC Managed Care – PPO | Admitting: Neurology

## 2023-08-24 VITALS — BP 136/81 | HR 74 | Ht 63.0 in | Wt 130.5 lb

## 2023-08-24 DIAGNOSIS — G40909 Epilepsy, unspecified, not intractable, without status epilepticus: Secondary | ICD-10-CM | POA: Diagnosis not present

## 2023-08-24 MED ORDER — LAMOTRIGINE 200 MG PO TABS: 200.0000 mg | ORAL_TABLET | Freq: Two times a day (BID) | ORAL | 3 refills | Status: DC

## 2023-08-24 NOTE — Progress Notes (Signed)
[facility]  PATIENT: Michelle Horn DOB: [birth year]   PRIMARY NEUROLOGIST: Dr. Teresa Coombs REQUESTING CLINICIAN: Deirdre Priest Estill Batten, * HISTORY FROM: Patient and mother  REASON FOR VISIT: Generalized epilepsy.    HISTORICAL  CHIEF COMPLAINT:  Chief Complaint  Patient presents with   Seizures    Rm12, mother present, RU:EAVW episode 07/03/23, denied missing medication at that time.    INTERVAL HISTORY 08/24/2023:  Patient presents today for follow-up, she is accompanied by her mother.  Last visit was in August 2024.  At that time she was complaining of seizures, lamotrigine increased to 150 mg twice daily.  She reports compliance with the medication but continued to have seizures, in September, October and November 2024, she did have breakthrough seizures despite compliance with medication.  Her last seizure was on November 25.  She denies any provoking factor, denies missing her medication.  Denies any injury from her seizures.   Update 04/03/2023 JM: Patient returns for follow-up visit after prior visit with Dr. Teresa Coombs over 1 year ago.  She is accompanied by her mother.  Reports 3 seizures this year, was seen in ED in March for typical type seizure, continue lamotrigine 100 mg twice daily.  Reports additional seizure in May, evaluated by EMS but did not proceed to ED, typical type seizure but postictal confusion lasted for about 1 hour. Reports seizure 2 weeks ago while on cruise, typical type seizure, "mild" per mother.  Denies any missed dosages of lamotrigine, denies any provoked factors around the time of seizures such as illness/infection, sleep deprivation, drastic change in diet, etc. Reports tolerating lamotrigine dosage well.   History provided for reference purposes only Update 01/26/2022 Dr. Teresa Coombs:  Patient presents today for follow-up, she is accompanied by her mother.  At last visit, plan was to start lamotrigine and to switch patient from Keppra to  lamotrigine due to side effect of Keppra.  She reported self discontinued the Keppra, currently is taking lamotrigine 75 mg twice daily.  She reported a breakthrough seizure on June 9 in the setting of missing her medication, she did not contact the office.  Otherwise no other complaint and no other concerns.  Consult visit 12/15/2021 Dr. Teresa Coombs: This is a 23 year old woman past medical history of migraine headaches who is presenting to establish care for her generalized epilepsy disorder.  Patient reports her first seizure happened in fall 2022.  Mom describes her seizures as patient being unresponsive, eyes glazed and followed by a full generalized convulsion with foaming at the mouth and tongue biting.  There was no urinary incontinence.  She presented to the ED after her first seizure, was stable and patient referred to pediatric neurology.  She did follow-up with pediatric neurologist, had a EEG which was normal.  She had a second seizure in December 2022 and at that time she was started on Keppra but was not taking it consistently.  Patient report her last seizure was on October 28, 2021, at that time she was restarted on Keppra 500 mg twice daily.  Currently she is taking 500 mg daily, denies any additional seizures but report side effect of sleepiness, and mood swing.  She denies any family history of seizure, denies any seizure risk factors. In terms of her headache she does report right-sided headache, couple times a week which photophobia, no nausea no vomiting.  States that Tylenol and ibuprofen helps with her headaches.     Handedness: Right handed   Onset: 11/2020  Seizure Type: Generalized convulsion  Current frequency: Continue to have on average 1 seizure monthly or every 6 weeks. Last one November 2024  Any injuries from seizures: tongue biting   Seizure risk factors: None reported   Previous ASMs: Levetiracetam   Currenty ASMs: Lamotrigine 200 mg twice daily   ASMs side  effects: Sleepy and vomiting  Brain Images: Normal MRI   Previous EEGs: Normal    OTHER MEDICAL CONDITIONS: Migraines   REVIEW OF SYSTEMS: Full 14 system review of systems performed and negative with exception of: as noted in the HPI   ALLERGIES: No Known Allergies  HOME MEDICATIONS: Outpatient Medications Prior to Visit  Medication Sig Dispense Refill   lamoTRIgine (LAMICTAL) 150 MG tablet TAKE 1 TABLET BY MOUTH TWICE A DAY 180 tablet 2   ondansetron (ZOFRAN) 4 MG tablet Take 1 tablet (4 mg total) by mouth every 8 (eight) hours as needed for nausea or vomiting. 20 tablet 0   No facility-administered medications prior to visit.    PAST MEDICAL HISTORY: Past Medical History:  Diagnosis Date   COVID-19 affecting pregnancy, antepartum 08/24/2020   Epilepsy (HCC)    Group beta Strep positive 11/17/2020   Iron deficiency anemia of pregnancy 10/23/2020   Medical history non-contributory    Normal labor 11/17/2020   Supervision of normal first teen pregnancy 08/24/2020    PAST SURGICAL HISTORY: Past Surgical History:  Procedure Laterality Date   NO PAST SURGERIES      FAMILY HISTORY: Family History  Problem Relation Age of Onset   Anxiety disorder Mother    Migraines Sister    Anxiety disorder Sister    Migraines Sister    Migraines Maternal Grandmother    Anxiety disorder Maternal Grandfather     SOCIAL HISTORY: Social History   Socioeconomic History   Marital status: Single    Spouse name: Not on file   Number of children: Not on file   Years of education: Not on file   Highest education level: Not on file  Occupational History   Not on file  Tobacco Use   Smoking status: Every Day    Types: Cigars    Passive exposure: Never   Smokeless tobacco: Never  Substance and Sexual Activity   Alcohol use: Never   Drug use: Never   Sexual activity: Yes    Birth control/protection: None  Other Topics Concern   Not on file  Social History Narrative   Maripaz  is 23 years old.   Work in [employer].   Considering college when baby is older to communicate   Social Drivers of Corporate investment banker Strain: Not on file  Food Insecurity: Not on file  Transportation Needs: Not on file  Physical Activity: Not on file  Stress: Not on file  Social Connections: Not on file  Intimate Partner Violence: Not on file    PHYSICAL EXAM  GENERAL EXAM/CONSTITUTIONAL: Vitals:  Today's Vitals   08/24/23 1126  BP: 136/81  Pulse: 74  SpO2: 95%  Weight: 130 lb 8 oz (59.2 kg)  Height: 5\' 3"  (1.6 m)   Body mass index is 23.12 kg/m.   Patient is in no distress; well developed, nourished and groomed; neck is supple  MUSCULOSKELETAL: Gait, strength, tone, movements noted in Neurologic exam below  NEUROLOGIC: MENTAL STATUS:      No data to display         awake, alert, oriented to person, place and time recent and remote memory intact normal attention and concentration language  fluent, comprehension intact, naming intact fund of knowledge appropriate  CRANIAL NERVE:  2nd, 3rd, 4th, 6th - visual fields full to confrontation, extraocular muscles intact, no nystagmus 5th - facial sensation symmetric 7th - facial strength symmetric 8th - hearing intact 9th - palate elevates symmetrically, uvula midline 11th - shoulder shrug symmetric 12th - tongue protrusion midline  MOTOR:  normal bulk and tone, full strength in the BUE, BLE  SENSORY:  normal and symmetric to light touch  COORDINATION:  finger-nose-finger, fine finger movements normal  GAIT/STATION:  normal   DIAGNOSTIC DATA (LABS, IMAGING, TESTING) - I reviewed patient records, labs, notes, testing and imaging myself where available.  Lab Results  Component Value Date   WBC 12.2 (H) 10/07/2022   HGB 15.8 (H) 10/07/2022   HCT 48.5 (H) 10/07/2022   MCV 89.3 10/07/2022   PLT 378 10/07/2022      Component Value Date/Time   NA 141 04/03/2023 0814   K 4.1 04/03/2023  0814   CL 102 04/03/2023 0814   CO2 23 04/03/2023 0814   GLUCOSE 88 04/03/2023 0814   GLUCOSE 94 10/07/2022 1100   BUN 9 04/03/2023 0814   CREATININE 0.80 04/03/2023 0814   CALCIUM 9.8 04/03/2023 0814   PROT 7.0 04/03/2023 0814   ALBUMIN 4.7 04/03/2023 0814   AST 20 04/03/2023 0814   ALT 16 04/03/2023 0814   ALKPHOS 84 04/03/2023 0814   BILITOT 0.3 04/03/2023 0814   GFRNONAA >60 10/07/2022 1100   GFRAA CANCELED 02/25/2020 0941   No results found for: "CHOL", "HDL", "LDLCALC", "LDLDIRECT", "TRIG" No results found for: "HGBA1C" No results found for: "VITAMINB12" Lab Results  Component Value Date   TSH 0.833 02/25/2020    Ambulatory EEG 08/2021 This prolonged ambulatory video EEG for 45 hours is normal with no epileptiform discharges or seizure activity.  There were no transient rhythmic activities or electrographic seizures noted.  Although there were frequent muscle artifact as well as electrode artifacts noted throughout the recording particularly the second part of the recording. There were no clinical seizure activity or pushbutton events noted or reported.  Background activity was normal.  Please note that a normal EEG does not exclude epilepsy, clinical correlation is indicated.   MRI Brain 12/29/2021:  Unremarkable MRI scan of the brain with and without contrast   ASSESSMENT AND PLAN  23 y.o. year old female  with migraine headache and seizure disorder who is presenting for follow up for her seizures.  Patient continue to have  breakthough seizure, on average once monthy or every 6 weeks. Will increase Lamotrigine to 200 mg twice daily and obtain an ambulatory EEG. I will contact her to go over the results, othewise, I will see her I 6 months for follow up or sooner if worse.    1. Seizure disorder (HCC)     -Increase lamotrigine to 200 mg BID -Will check ambulatory EEG -no driving for 6 months post seizure activity per [place] law -Advised to contact office with any  additional seizures -Advised to notify office with any further seizure activity -Return in 6 months or sooner if worse    Orders Placed This Encounter  Procedures   AMBULATORY EEG    Meds ordered this encounter  Medications   lamoTRIgine (LAMICTAL) 200 MG tablet    Sig: Take 1 tablet (200 mg total) by mouth 2 (two) times daily.    Dispense:  180 tablet    Refill:  3    Return in about 6 months (  around 02/21/2024).   Windell Norfolk, MD [facility] [address] [place], [place] [address] Phone [phone] Fax [phone]

## 2023-08-28 ENCOUNTER — Telehealth: Payer: Self-pay

## 2023-08-28 NOTE — Telephone Encounter (Signed)
Emailed referral to [facility] for Ambulatory EEG

## 2023-11-03 ENCOUNTER — Encounter (HOSPITAL_COMMUNITY): Payer: Self-pay

## 2023-11-03 ENCOUNTER — Emergency Department (HOSPITAL_COMMUNITY)

## 2023-11-03 ENCOUNTER — Other Ambulatory Visit: Payer: Self-pay

## 2023-11-03 ENCOUNTER — Observation Stay (HOSPITAL_COMMUNITY)
Admission: EM | Admit: 2023-11-03 | Discharge: 2023-11-05 | Disposition: A | Discharge status: Home / Self Care | Attending: Internal Medicine | Admitting: Internal Medicine

## 2023-11-03 DIAGNOSIS — G40909 Epilepsy, unspecified, not intractable, without status epilepticus: Principal | ICD-10-CM | POA: Insufficient documentation

## 2023-11-03 DIAGNOSIS — R569 Unspecified convulsions: Secondary | ICD-10-CM | POA: Diagnosis not present

## 2023-11-03 DIAGNOSIS — G40919 Epilepsy, unspecified, intractable, without status epilepticus: Secondary | ICD-10-CM | POA: Diagnosis present

## 2023-11-03 DIAGNOSIS — D72829 Elevated white blood cell count, unspecified: Secondary | ICD-10-CM | POA: Insufficient documentation

## 2023-11-03 DIAGNOSIS — R000 Tachycardia, unspecified: Secondary | ICD-10-CM | POA: Diagnosis not present

## 2023-11-03 DIAGNOSIS — E872 Acidosis, unspecified: Secondary | ICD-10-CM | POA: Diagnosis not present

## 2023-11-03 DIAGNOSIS — Z8616 Personal history of COVID-19: Secondary | ICD-10-CM | POA: Insufficient documentation

## 2023-11-03 DIAGNOSIS — F17290 Nicotine dependence, other tobacco product, uncomplicated: Secondary | ICD-10-CM | POA: Insufficient documentation

## 2023-11-03 DIAGNOSIS — R059 Cough, unspecified: Secondary | ICD-10-CM | POA: Diagnosis not present

## 2023-11-03 DIAGNOSIS — Z1152 Encounter for screening for COVID-19: Secondary | ICD-10-CM | POA: Diagnosis not present

## 2023-11-03 DIAGNOSIS — I10 Essential (primary) hypertension: Secondary | ICD-10-CM | POA: Diagnosis not present

## 2023-11-03 DIAGNOSIS — N179 Acute kidney failure, unspecified: Secondary | ICD-10-CM | POA: Diagnosis not present

## 2023-11-03 DIAGNOSIS — Z79899 Other long term (current) drug therapy: Secondary | ICD-10-CM | POA: Insufficient documentation

## 2023-11-03 DIAGNOSIS — R4182 Altered mental status, unspecified: Secondary | ICD-10-CM | POA: Diagnosis not present

## 2023-11-03 DIAGNOSIS — R404 Transient alteration of awareness: Secondary | ICD-10-CM | POA: Diagnosis not present

## 2023-11-03 DIAGNOSIS — G40901 Epilepsy, unspecified, not intractable, with status epilepticus: Principal | ICD-10-CM

## 2023-11-03 LAB — CBC WITH DIFFERENTIAL/PLATELET
Abs Immature Granulocytes: 0 10*3/uL (ref 0.00–0.07)
Basophils Absolute: 0 10*3/uL (ref 0.0–0.1)
Basophils Relative: 0 %
Eosinophils Absolute: 0.3 10*3/uL (ref 0.0–0.5)
Eosinophils Relative: 1 %
HCT: 44.3 % (ref 36.0–46.0)
Hemoglobin: 14.5 g/dL (ref 12.0–15.0)
Lymphocytes Relative: 5 %
Lymphs Abs: 1.3 10*3/uL (ref 0.7–4.0)
MCH: 31 pg (ref 26.0–34.0)
MCHC: 32.7 g/dL (ref 30.0–36.0)
MCV: 94.9 fL (ref 80.0–100.0)
Monocytes Absolute: 2.8 10*3/uL — ABNORMAL HIGH (ref 0.1–1.0)
Monocytes Relative: 11 %
Neutro Abs: 21 10*3/uL — ABNORMAL HIGH (ref 1.7–7.7)
Neutrophils Relative %: 83 %
Platelets: 348 10*3/uL (ref 150–400)
RBC: 4.67 MIL/uL (ref 3.87–5.11)
RDW: 12.8 % (ref 11.5–15.5)
WBC: 25.3 10*3/uL — ABNORMAL HIGH (ref 4.0–10.5)
nRBC: 0 % (ref 0.0–0.2)
nRBC: 0 /100{WBCs}

## 2023-11-03 LAB — RAPID URINE DRUG SCREEN, HOSP PERFORMED
Amphetamines: NOT DETECTED
Barbiturates: NOT DETECTED
Benzodiazepines: POSITIVE — AB
Cocaine: NOT DETECTED
Opiates: NOT DETECTED
Tetrahydrocannabinol: POSITIVE — AB

## 2023-11-03 LAB — COMPREHENSIVE METABOLIC PANEL WITH GFR
ALT: 22 U/L (ref 0–44)
AST: 32 U/L (ref 15–41)
Albumin: 5.1 g/dL — ABNORMAL HIGH (ref 3.5–5.0)
Alkaline Phosphatase: 90 U/L (ref 38–126)
Anion gap: 20 — ABNORMAL HIGH (ref 5–15)
BUN: 19 mg/dL (ref 6–20)
CO2: 15 mmol/L — ABNORMAL LOW (ref 22–32)
Calcium: 9.6 mg/dL (ref 8.9–10.3)
Chloride: 101 mmol/L (ref 98–111)
Creatinine, Ser: 2.57 mg/dL — ABNORMAL HIGH (ref 0.44–1.00)
GFR, Estimated: 27 mL/min — ABNORMAL LOW (ref >60)
Glucose, Bld: 168 mg/dL — ABNORMAL HIGH (ref 70–99)
Potassium: 4 mmol/L (ref 3.5–5.1)
Sodium: 136 mmol/L (ref 135–145)
Total Bilirubin: 0.7 mg/dL (ref 0.0–1.2)
Total Protein: 8.1 g/dL (ref 6.5–8.1)

## 2023-11-03 LAB — SALICYLATE LEVEL: Salicylate Lvl: <7 mg/dL — ABNORMAL LOW (ref 7.0–30.0)

## 2023-11-03 LAB — RESP PANEL BY RT-PCR (RSV, FLU A&B, COVID)  RVPGX2
Influenza A by PCR: NEGATIVE
Influenza B by PCR: NEGATIVE
Resp Syncytial Virus by PCR: NEGATIVE
SARS Coronavirus 2 by RT PCR: NEGATIVE

## 2023-11-03 LAB — LIPASE, BLOOD: Lipase: 18 U/L (ref 11–51)

## 2023-11-03 LAB — CBG MONITORING, ED: Glucose-Capillary: 160 mg/dL — ABNORMAL HIGH (ref 70–99)

## 2023-11-03 LAB — ETHANOL: Alcohol, Ethyl (B): <10 mg/dL (ref <10)

## 2023-11-03 LAB — HCG, SERUM, QUALITATIVE: Preg, Serum: NEGATIVE

## 2023-11-03 LAB — ACETAMINOPHEN LEVEL: Acetaminophen (Tylenol), Serum: <10 ug/mL — ABNORMAL LOW (ref 10–30)

## 2023-11-03 MED ORDER — LEVETIRACETAM IN NACL 1500 MG/100ML IV SOLN
1500.0000 mg | Freq: Once | INTRAVENOUS | Status: AC
  Administered 2023-11-03: 1500 mg via INTRAVENOUS
  Filled 2023-11-03: qty 100

## 2023-11-03 MED ORDER — HYDROMORPHONE HCL 1 MG/ML IJ SOLN
1.0000 mg | Freq: Once | INTRAMUSCULAR | Status: AC
  Administered 2023-11-03: 1 mg via INTRAVENOUS
  Filled 2023-11-03: qty 1

## 2023-11-03 MED ORDER — MIDAZOLAM HCL 2 MG/2ML IJ SOLN: 4.0000 mg | Freq: Once | INTRAMUSCULAR | Status: DC | PRN

## 2023-11-03 MED ORDER — ONDANSETRON HCL 4 MG/2ML IJ SOLN
4.0000 mg | Freq: Once | INTRAMUSCULAR | Status: AC
Start: 2023-11-03 — End: 2023-11-03
  Administered 2023-11-03: 4 mg via INTRAVENOUS
  Filled 2023-11-03: qty 2

## 2023-11-03 MED ORDER — SODIUM CHLORIDE 0.9 % IV BOLUS
1000.0000 mL | Freq: Once | INTRAVENOUS | Status: AC
  Administered 2023-11-03: 1000 mL via INTRAVENOUS

## 2023-11-03 NOTE — ED Notes (Signed)
 Dr. Derry Lory neurologist at bedside.

## 2023-11-03 NOTE — ED Provider Notes (Signed)
 Emergency Department Provider Note   I have reviewed the triage vital signs and the nursing notes.   HISTORY  Chief Complaint Seizures   HPI Michelle Horn is a 23 y.o. female with past history of epilepsy on Lamictal presents to the emergency department for evaluation after breakthrough seizures.  EMS report 9 seizure episodes today, 1 witnessed by medic first on scene.  Family reported 8 seizures earlier in the day.  Unclear the length of time but the witnessed seizure was around 1 to 2 minutes.  It stopped with 5 mg of Versed given in the field. Family report compliance with meds at home.   Level 5 caveat: Patient is post-ictal.   Parents now at bedside report back-to-back seizures throughout most of the day. They estimate 10 seizures without clearly returning to normal. She has had multiple episodes of vomiting throughout the day. No fever. Yesterday was normal. They report compliance with seizure meds.   Past Medical History:  Diagnosis Date   COVID-19 affecting pregnancy, antepartum 08/24/2020   Epilepsy (HCC)    Group beta Strep positive 11/17/2020   Iron deficiency anemia of pregnancy 10/23/2020   Medical history non-contributory    Normal labor 11/17/2020   Supervision of normal first teen pregnancy 08/24/2020    Review of Systems  Level 5 caveat: post-ictal  ____________________________________________   PHYSICAL EXAM:  VITAL SIGNS: Vitals:   11/05/23 0400 11/05/23 0928  BP: 111/66 112/73  Pulse:  91  Resp: 18 14  Temp: 98.5 F (36.9 C) 98 F (36.7 C)  SpO2: 99% 100%    Constitutional: Drowsy with purposeful movements at times. Awakens to voice, makes eye contact, but does not speak.  Eyes: Conjunctivae are normal. PERRL. EOMI. Head: Atraumatic. Nose: No congestion/rhinnorhea. Mouth/Throat: Mucous membranes are moist.   Neck: No stridor.  Cardiovascular: Normal rate, regular rhythm. Good peripheral circulation. Grossly normal heart sounds.    Respiratory: Normal respiratory effort.  No retractions. Lungs CTAB. Gastrointestinal: Soft and nontender. No distention.  Musculoskeletal: No lower extremity tenderness nor edema. No gross deformities of extremities. Neurologic:  Normal speech and language. No gross focal neurologic deficits are appreciated.  Skin:  Skin is warm, dry and intact. No rash noted  ____________________________________________   LABS (all labs ordered are listed, but only abnormal results are displayed)  Labs Reviewed  COMPREHENSIVE METABOLIC PANEL WITH GFR - Abnormal; Notable for the following components:      Result Value   CO2 15 (*)    Glucose, Bld 168 (*)    Creatinine, Ser 2.57 (*)    Albumin 5.1 (*)    GFR, Estimated 27 (*)    Anion gap 20 (*)    All other components within normal limits  ACETAMINOPHEN LEVEL - Abnormal; Notable for the following components:   Acetaminophen (Tylenol), Serum <10 (*)    All other components within normal limits  SALICYLATE LEVEL - Abnormal; Notable for the following components:   Salicylate Lvl <7.0 (*)    All other components within normal limits  CBC WITH DIFFERENTIAL/PLATELET - Abnormal; Notable for the following components:   WBC 25.3 (*)    Neutro Abs 21.0 (*)    Monocytes Absolute 2.8 (*)    All other components within normal limits  RAPID URINE DRUG SCREEN, HOSP PERFORMED - Abnormal; Notable for the following components:   Benzodiazepines POSITIVE (*)    Tetrahydrocannabinol POSITIVE (*)    All other components within normal limits  BASIC METABOLIC PANEL WITH GFR - Abnormal;  Notable for the following components:   CO2 15 (*)    Glucose, Bld 100 (*)    Creatinine, Ser 1.93 (*)    GFR, Estimated 38 (*)    All other components within normal limits  CBC - Abnormal; Notable for the following components:   WBC 17.1 (*)    All other components within normal limits  MAGNESIUM - Abnormal; Notable for the following components:   Magnesium 2.7 (*)    All  other components within normal limits  PHOSPHORUS - Abnormal; Notable for the following components:   Phosphorus 6.0 (*)    All other components within normal limits  CK - Abnormal; Notable for the following components:   Total CK 352 (*)    All other components within normal limits  BLOOD GAS, VENOUS - Abnormal; Notable for the following components:   pCO2, Ven 34 (*)    pO2, Ven 114 (*)    Bicarbonate 16.0 (*)    Acid-base deficit 9.8 (*)    All other components within normal limits  COMPREHENSIVE METABOLIC PANEL WITH GFR - Abnormal; Notable for the following components:   CO2 20 (*)    Creatinine, Ser 1.17 (*)    All other components within normal limits  CK - Abnormal; Notable for the following components:   Total CK 522 (*)    All other components within normal limits  CBG MONITORING, ED - Abnormal; Notable for the following components:   Glucose-Capillary 160 (*)    All other components within normal limits  RESP PANEL BY RT-PCR (RSV, FLU A&B, COVID)  RVPGX2  RESPIRATORY PANEL BY PCR  ETHANOL  LIPASE, BLOOD  HCG, SERUM, QUALITATIVE  HIV ANTIBODY (ROUTINE TESTING W REFLEX)  LACTIC ACID, PLASMA  CBC  MAGNESIUM  PHOSPHORUS  LAMOTRIGINE LEVEL   ____________________________________________  EKG   EKG Interpretation Date/Time:  Friday November 03 2023 21:21:03 EDT Ventricular Rate:  120 PR Interval:  135 QRS Duration:  82 QT Interval:  324 QTC Calculation: 458 R Axis:   50  Text Interpretation: Sinus tachycardia Borderline T wave abnormalities Confirmed by Alona Bene [phone]) on 11/03/2023 9:46:36 PM       ____________________________________________   PROCEDURES  Procedure(s) performed:   Procedures  CRITICAL CARE Performed by: Maia Plan Total critical care time: 35 minutes Critical care time was exclusive of separately billable procedures and treating other patients. Critical care was necessary to treat or prevent imminent or life-threatening  deterioration. Critical care was time spent personally by me on the following activities: development of treatment plan with patient and/or surrogate as well as nursing, discussions with consultants, evaluation of patient's response to treatment, examination of patient, obtaining history from patient or surrogate, ordering and performing treatments and interventions, ordering and review of laboratory studies, ordering and review of radiographic studies, pulse oximetry and re-evaluation of patient's condition.  Alona Bene, MD Emergency Medicine  ____________________________________________   INITIAL IMPRESSION / ASSESSMENT AND PLAN / ED COURSE  Pertinent labs & imaging results that were available during my care of the patient were reviewed by me and considered in my medical decision making (see chart for details).   This patient is Presenting for Evaluation of AMS, which does require a range of treatment options, and is a complaint that involves a high risk of morbidity and mortality.  The Differential Diagnoses includes but is not exclusive to alcohol, illicit or prescription medications, intracranial pathology such as stroke, intracerebral hemorrhage, fever or infectious causes including sepsis, hypoxemia, uremia,  trauma, endocrine related disorders such as diabetes, hypoglycemia, thyroid-related diseases, etc.   Critical Interventions-    Medications  sodium chloride 0.9 % bolus 1,000 mL (0 mLs Intravenous Stopped 11/03/23 2300)  levETIRAcetam (KEPPRA) IVPB 1500 mg/ 100 mL premix (0 mg Intravenous Stopped 11/03/23 2300)  HYDROmorphone (DILAUDID) injection 1 mg (1 mg Intravenous Given 11/03/23 2354)  ondansetron (ZOFRAN) injection 4 mg (4 mg Intravenous Given 11/03/23 2352)  sodium chloride 0.9 % bolus 1,000 mL (0 mLs Intravenous Stopped 11/04/23 0706)  ibuprofen (ADVIL) tablet 800 mg (800 mg Oral Given 11/04/23 1511)    Reassessment after intervention: no additional seizure activity.     I did obtain Additional Historical Information from EMS.  I decided to review pertinent External Data, and in summary patient followed by [facility].   Clinical Laboratory Tests Ordered, included CBC with leukocytosis to 25.3 without anemia. CMP with AKI showing 2.57.   Radiologic Tests Ordered, included CT head. I independently interpreted the images and agree with radiology interpretation.   Cardiac Monitor Tracing which shows tachycardia.   Social Determinants of Health Risk patient is a smoker.   Consult complete with Neurology. They will consult. Advise Keppra load.   TRH. Plan for admit.   Medical Decision Making: Summary:  Patient presents to the emergency department with breakthrough seizure.  Reported 9 seizures today but 1 was witnessed by [employer] on scene.  Initially unclear if patient is fully returning to baseline after events. Post-ictal on arrival but maintaining vitals and airway. Versed given PTA.   10:28 PM  Patient more awake, alert, talking with family at bedside.   Reevaluation with update and discussion with patient and family. CT head pending but patient back to near baseline. Plan for admit on Keppra. Patient and family in agreement.   Patient's presentation is most consistent with acute presentation with potential threat to life or bodily function.   Disposition: admit  ____________________________________________  FINAL CLINICAL IMPRESSION(S) / ED DIAGNOSES  Final diagnoses:  Status epilepticus (HCC)     NEW OUTPATIENT MEDICATIONS STARTED DURING THIS VISIT:  Discharge Medication List as of 11/05/2023 10:24 AM     START taking these medications   Details  ondansetron (ZOFRAN) 4 MG tablet Take 1 tablet (4 mg total) by mouth every 8 (eight) hours as needed for up to 7 days for nausea or vomiting., Starting Sun 11/05/2023, Until Sun 11/12/2023 at 2359, Normal        Note:  This document was prepared using Dragon voice recognition  software and may include unintentional dictation errors.  Alona Bene, MD, [facility] Emergency Medicine    Hydeia Mcatee, Arlyss Repress, MD 11/06/23 [phone]

## 2023-11-03 NOTE — ED Triage Notes (Signed)
 Pt arrived from home BIB [facility] for reported 8 witnessed seizures with family, one witnessed with EMS, give 5 mg versed IM PTA, pt arrived postictal, but alert, not answering questions, appears confused at current. NRB applied by EMS. Family reported pt has been vomiting  all day unable to keep anything down , no rescue seizures meds administer by family. Pt is compliance with her Lamictal BID, no miss doses.   EMS vitals 144/92 130 hr 99 % NRB 154 cbg

## 2023-11-03 NOTE — ED Notes (Signed)
 Pt given moisten swabs to wet her mouth as she has dry mouth, NPO at this time.

## 2023-11-04 ENCOUNTER — Observation Stay (HOSPITAL_COMMUNITY)

## 2023-11-04 ENCOUNTER — Emergency Department (HOSPITAL_COMMUNITY)

## 2023-11-04 DIAGNOSIS — D72829 Elevated white blood cell count, unspecified: Secondary | ICD-10-CM

## 2023-11-04 DIAGNOSIS — G40919 Epilepsy, unspecified, intractable, without status epilepticus: Secondary | ICD-10-CM | POA: Diagnosis not present

## 2023-11-04 DIAGNOSIS — N179 Acute kidney failure, unspecified: Secondary | ICD-10-CM | POA: Diagnosis not present

## 2023-11-04 DIAGNOSIS — R569 Unspecified convulsions: Secondary | ICD-10-CM | POA: Diagnosis not present

## 2023-11-04 LAB — HIV ANTIBODY (ROUTINE TESTING W REFLEX): HIV Screen 4th Generation wRfx: NONREACTIVE

## 2023-11-04 LAB — RESPIRATORY PANEL BY PCR

## 2023-11-04 LAB — CBC
HCT: 39.1 % (ref 36.0–46.0)
Hemoglobin: 13 g/dL (ref 12.0–15.0)
MCH: 31.3 pg (ref 26.0–34.0)
MCHC: 33.2 g/dL (ref 30.0–36.0)
MCV: 94 fL (ref 80.0–100.0)
Platelets: 297 10*3/uL (ref 150–400)
RBC: 4.16 MIL/uL (ref 3.87–5.11)
RDW: 12.9 % (ref 11.5–15.5)
WBC: 17.1 10*3/uL — ABNORMAL HIGH (ref 4.0–10.5)
nRBC: 0 % (ref 0.0–0.2)

## 2023-11-04 LAB — BLOOD GAS, VENOUS
Acid-base deficit: 9.8 mmol/L — ABNORMAL HIGH (ref 0.0–2.0)
Bicarbonate: 16 mmol/L — ABNORMAL LOW (ref 20.0–28.0)
O2 Saturation: 100 %
Patient temperature: 37
pCO2, Ven: 34 mmHg — ABNORMAL LOW (ref 44–60)
pH, Ven: 7.28 (ref 7.25–7.43)
pO2, Ven: 114 mmHg — ABNORMAL HIGH (ref 32–45)

## 2023-11-04 LAB — BASIC METABOLIC PANEL WITH GFR
Anion gap: 13 (ref 5–15)
BUN: 19 mg/dL (ref 6–20)
CO2: 15 mmol/L — ABNORMAL LOW (ref 22–32)
Calcium: 9 mg/dL (ref 8.9–10.3)
Chloride: 107 mmol/L (ref 98–111)
Creatinine, Ser: 1.93 mg/dL — ABNORMAL HIGH (ref 0.44–1.00)
GFR, Estimated: 38 mL/min — ABNORMAL LOW (ref >60)
Glucose, Bld: 100 mg/dL — ABNORMAL HIGH (ref 70–99)
Potassium: 4.5 mmol/L (ref 3.5–5.1)
Sodium: 135 mmol/L (ref 135–145)

## 2023-11-04 LAB — LACTIC ACID, PLASMA: Lactic Acid, Venous: 0.7 mmol/L (ref 0.5–1.9)

## 2023-11-04 LAB — MAGNESIUM: Magnesium: 2.7 mg/dL — ABNORMAL HIGH (ref 1.7–2.4)

## 2023-11-04 LAB — CK: Total CK: 352 U/L — ABNORMAL HIGH (ref 38–234)

## 2023-11-04 LAB — PHOSPHORUS: Phosphorus: 6 mg/dL — ABNORMAL HIGH (ref 2.5–4.6)

## 2023-11-04 MED ORDER — ENOXAPARIN SODIUM 30 MG/0.3ML IJ SOSY: 30.0000 mg | PREFILLED_SYRINGE | INTRAMUSCULAR | Status: DC

## 2023-11-04 MED ORDER — IBUPROFEN 200 MG PO TABS
200.0000 mg | ORAL_TABLET | Freq: Four times a day (QID) | ORAL | Status: DC | PRN
  Administered 2023-11-04: 200 mg via ORAL
  Filled 2023-11-04: qty 1

## 2023-11-04 MED ORDER — LAMOTRIGINE 100 MG PO TABS
250.0000 mg | ORAL_TABLET | Freq: Two times a day (BID) | ORAL | Status: DC
  Administered 2023-11-04 – 2023-11-05 (×4): 250 mg via ORAL
  Filled 2023-11-04: qty 10
  Filled 2023-11-04 (×3): qty 2

## 2023-11-04 MED ORDER — IBUPROFEN 200 MG PO TABS
800.0000 mg | ORAL_TABLET | Freq: Once | ORAL | Status: AC
  Administered 2023-11-04: 800 mg via ORAL
  Filled 2023-11-04: qty 4

## 2023-11-04 MED ORDER — MELATONIN 3 MG PO TABS: 6.0000 mg | ORAL_TABLET | Freq: Every evening | ORAL | Status: DC | PRN

## 2023-11-04 MED ORDER — SODIUM CHLORIDE 0.9 % IV BOLUS
1000.0000 mL | Freq: Once | INTRAVENOUS | Status: AC
  Administered 2023-11-04: 1000 mL via INTRAVENOUS

## 2023-11-04 MED ORDER — PROCHLORPERAZINE EDISYLATE 10 MG/2ML IJ SOLN
10.0000 mg | Freq: Four times a day (QID) | INTRAMUSCULAR | Status: DC | PRN
  Administered 2023-11-04 – 2023-11-05 (×2): 10 mg via INTRAVENOUS
  Filled 2023-11-04 (×2): qty 2

## 2023-11-04 MED ORDER — LORAZEPAM 2 MG/ML IJ SOLN: 2.0000 mg | INTRAMUSCULAR | Status: DC | PRN

## 2023-11-04 MED ORDER — ENOXAPARIN SODIUM 40 MG/0.4ML IJ SOSY
40.0000 mg | PREFILLED_SYRINGE | INTRAMUSCULAR | Status: DC
  Administered 2023-11-04: 40 mg via SUBCUTANEOUS
  Filled 2023-11-04: qty 0.4

## 2023-11-04 MED ORDER — ACETAMINOPHEN 500 MG PO TABS
1000.0000 mg | ORAL_TABLET | Freq: Four times a day (QID) | ORAL | Status: DC | PRN
  Administered 2023-11-04: 1000 mg via ORAL
  Filled 2023-11-04: qty 2

## 2023-11-04 MED ORDER — SODIUM CHLORIDE 0.9% FLUSH
3.0000 mL | Freq: Two times a day (BID) | INTRAVENOUS | Status: DC
  Administered 2023-11-04 – 2023-11-05 (×3): 3 mL via INTRAVENOUS

## 2023-11-04 NOTE — ED Notes (Addendum)
 Patient transported to CT

## 2023-11-04 NOTE — Progress Notes (Signed)
 Neurology Progress/Same Day Note  Brief HPI: 23 year old female with history of epilepsy following with Dr. Teresa Coombs at [facility] compliant on home lamotrigine 200 twice daily, IDA, presenting with cluster seizures.  On 11/03/2023 patient woke with an aura followed by 9 seizures over a period of a few hours and vomited with her AM dose of lamotrigine.  Patient with reported similar seizure cluster in November 2024 though current presentation is more dramatic per family at bedside.  Patient was given 5 mg of Versed by EMS and loaded with 20 mg/kg of Keppra in the ED, her LMTG dose was increased from 200 mg BID to 250 mg BID and she was admitted for further observation.  Subjective: EEG tech at bedside performing routine EEG this morning.  Parents at bedside.  Patient's father states patient is more sleepy than her baseline but she does report being tired of sleeping so much.  Patient states she is at baseline though she does seem drowsy with slowed and delayed responses.  No seizures reported since hospital arrival.  UDS + for [facility] Labs show leukocytosis 25.3 -> 17.1, likely reactive. AKI Cr. 2.57 -> 1.93 baseline creatinine 0.7-0.8. CK 352. Phos 6.0, Magnesium 2.7  Exam: Vitals:   11/04/23 0423 11/04/23 0439  BP:  117/72  Pulse:  (!) 102  Resp:  18  Temp: (!) 97.2 F (36.2 C) 98.8 F (37.1 C)  SpO2:  100%   Gen: Laying in hospital bed, in no acute distress Resp: non-labored breathing, no respiratory distress on room air Abd: soft, nt  Neuro: Mental Status: Patient is drowsy but wakes with repeated stimuli from EEG probe removal.  She is hypophonic with slowed and delayed verbal responses.  She is oriented to self, age, month, year, place, and situation.  She follows simple commands without difficulty.  No aphasia or dysarthria noted.  Cranial Nerves: PERRL, EOMI, tracks examiner throughout visual fields, VFF, face symmetric, facial sensation intact and symmetric, hearing is intact to voice, shoulder  shrug is symmetric, tongue is midline Motor: Moves all extremities spontaneously and antigravity without unilateral weakness or asymmetry.  Sensory: Sensation intact and symmetric to light touch throughout.  She does complain of sensory disturbance to light touch on the left upper extremity associated with antecubital PIV pain and irritation and requests left AC PIV removal.  Gait: Deferred  Pertinent Labs: CBC    Component Value Date/Time   WBC 17.1 (H) 11/04/2023 0351   RBC 4.16 11/04/2023 0351   HGB 13.0 11/04/2023 0351   HGB 10.7 (L) 09/08/2020 1709   HCT 39.1 11/04/2023 0351   HCT 31.4 (L) 09/08/2020 1709   PLT 297 11/04/2023 0351   PLT 286 09/08/2020 1709   MCV 94.0 11/04/2023 0351   MCV 90 09/08/2020 1709   MCH 31.3 11/04/2023 0351   MCHC 33.2 11/04/2023 0351   RDW 12.9 11/04/2023 0351   RDW 12.2 09/08/2020 1709   LYMPHSABS 1.3 11/03/2023 2111   LYMPHSABS 2.1 04/15/2020 1029   MONOABS 2.8 (H) 11/03/2023 2111   EOSABS 0.3 11/03/2023 2111   EOSABS 0.3 04/15/2020 1029   BASOSABS 0.0 11/03/2023 2111   BASOSABS 0.1 04/15/2020 1029   CMP     Component Value Date/Time   NA 135 11/04/2023 0351   NA 141 04/03/2023 0814   K 4.5 11/04/2023 0351   CL 107 11/04/2023 0351   CO2 15 (L) 11/04/2023 0351   GLUCOSE 100 (H) 11/04/2023 0351   BUN 19 11/04/2023 0351   BUN 9 04/03/2023 0814  CREATININE 1.93 (H) 11/04/2023 0351   CALCIUM 9.0 11/04/2023 0351   PROT 8.1 11/03/2023 2111   PROT 7.0 04/03/2023 0814   ALBUMIN 5.1 (H) 11/03/2023 2111   ALBUMIN 4.7 04/03/2023 0814   AST 32 11/03/2023 2111   ALT 22 11/03/2023 2111   ALKPHOS 90 11/03/2023 2111   BILITOT 0.7 11/03/2023 2111   BILITOT 0.3 04/03/2023 0814   EGFR 109 04/03/2023 0814   GFRNONAA 38 (L) 11/04/2023 0351   Drugs of Abuse     Component Value Date/Time   LABOPIA NONE DETECTED 11/03/2023 2112   COCAINSCRNUR NONE DETECTED 11/03/2023 2112   COCAINSCRNUR Negative 06/12/2021 1956   LABBENZ POSITIVE (A) 11/03/2023  2112   AMPHETMU NONE DETECTED 11/03/2023 2112   THCU POSITIVE (A) 11/03/2023 2112   LABBARB NONE DETECTED 11/03/2023 2112   Imaging Reviewed:  CT Head: No evidence of acute intracranial abnormality.   Assessment: 23 year old female with history of epilepsy compliant with home lamotrigine presenting with breakthrough cluster seizures with reported 9 seizures PTA. No seizures since hospitalization.  Patient was loaded with 20 mg/kg of Keppra and home lamotrigine was increased from 200 mg twice daily to 250 mg twice daily.  Routine EEG pending.  CT head negative.  UDS positive for THC.  Leukocytosis without fever with WBC 25.3 -> 17.1, likely reactive but will need UA to rule out infection as cause of lowered seizure threshold (CXR without active disease, respiratory panel negative). Further work up reveals AKI Cr. 2.57 -> 1.93 with baseline ~ 0.8. CK elevation to 352. Hyperphosphatemia 6.0, likely reactive.   Impression:  Breakthrough cluster seizures  Recommendations: - Continue increased dose of home LMTG at 250 mg BID - Routine EEG pending - UA to rule out infection as cause of lowered seizure threshold - Continue seizure precautions - 2 mg Ativan STAT for any seizure activity lasting > 3 minutes and notify neurology - Follow up with outpatient neurologist for plan for consideration of alternative route of administration benzodiazepine for cluster seizures due to vomiting/PO intolerance associated with cluster seizures.   Seizure precautions: Per [facility] statutes, patients with seizures are not allowed to drive until they have been seizure-free for six months and cleared by a physician    Use caution when using heavy equipment or power tools. Avoid working on ladders or at heights. Take showers instead of baths. Ensure the water temperature is not too high on the home water heater. Do not go swimming alone. Do not lock yourself in a room alone (i.e. bathroom). When caring for  infants or small children, sit down when holding, feeding, or changing them to minimize risk of injury to the child in the event you have a seizure. Maintain good sleep hygiene. Avoid alcohol.    If patient has another seizure, call 911 and bring them back to the ED if: A.  The seizure lasts longer than 5 minutes.      B.  The patient doesn't wake shortly after the seizure or has new problems such as difficulty seeing, speaking or moving following the seizure C.  The patient was injured during the seizure D.  The patient has a temperature over 102 F (39C) E.  The patient vomited during the seizure and now is having trouble breathing    During the Seizure   - First, ensure adequate ventilation and place patients on the floor on their left side  Loosen clothing around the neck and ensure the airway is patent. If the patient  is clenching the teeth, do not force the mouth open with any object as this can cause severe damage - Remove all items from the surrounding that can be hazardous. The patient may be oblivious to what's happening and may not even know what he or she is doing. If the patient is confused and wandering, either gently guide him/her away and block access to outside areas - Reassure the individual and be comforting - Call 911. In most cases, the seizure ends before EMS arrives. However, there are cases when seizures may last over 3 to 5 minutes. Or the individual may have developed breathing difficulties or severe injuries. If a pregnant patient or a person with diabetes develops a seizure, it is prudent to call an ambulance. - Finally, if the patient does not regain full consciousness, then call EMS. Most patients will remain confused for about 45 to 90 minutes after a seizure, so you must use judgment in calling for help. - Avoid restraints but make sure the patient is in a bed with padded side rails - Place the individual in a lateral position with the neck slightly flexed; this will  help the saliva drain from the mouth and prevent the tongue from falling backward - Remove all nearby furniture and other hazards from the area - Provide verbal assurance as the individual is regaining consciousness - Provide the patient with privacy if possible - Call for help and start treatment as ordered by the caregiver    After the Seizure (Postictal Stage)   After a seizure, most patients experience confusion, fatigue, muscle pain and/or a headache. Thus, one should permit the individual to sleep. For the next few days, reassurance is essential. Being calm and helping reorient the person is also of importance.   Most seizures are painless and end spontaneously. Seizures are not harmful to others but can lead to complications such as stress on the lungs, brain and the heart. Individuals with prior lung problems may develop labored breathing and respiratory distress.   Discussed with attending MD who is in agreement.   Lanae Boast, AGACNP-BC [facility] [phone]

## 2023-11-04 NOTE — Progress Notes (Signed)
 EEG complete - results pending

## 2023-11-04 NOTE — H&P (Addendum)
 History and Physical    Michelle Horn WUJ:[id number] DOB: Jul 12, 2001 DOA: 11/03/2023  PCP: Billey Co, MD   Patient coming from: Home   Chief Complaint:  Chief Complaint  Patient presents with   Seizures    HPI:  Michelle Horn is a 23 y.o. female with hx of epilepsy, who presents with breakthrough seizures. Reports last seizure before today was in November. Today developed recurrent breakthrough seizures, in all having ~ 10 episodes of tonic-clonic seizures. Not completely returning to baseline between seizures. Also having vomiting between episodes of seizures without c/o abd pain or diarrhea. C/o headache and mild neck stiffness. Denies any recent fever, chills, cough / cold, chest pain, dysuria, rashes. Acknowledges using marijuana but no other drug use. No other changes in medications, has been compliant with Lamotrigine but threw up AM dose this morning.     Review of Systems:  ROS complete and negative except as marked above   No Known Allergies  Prior to Admission medications   Medication Sig Start Date End Date Taking? Authorizing Provider  lamoTRIgine (LAMICTAL) 200 MG tablet Take 1 tablet (200 mg total) by mouth 2 (two) times daily. 08/24/23 08/18/24 Yes Windell Norfolk, MD    Past Medical History:  Diagnosis Date   COVID-19 affecting pregnancy, antepartum 08/24/2020   Epilepsy (HCC)    Group beta Strep positive 11/17/2020   Iron deficiency anemia of pregnancy 10/23/2020   Medical history non-contributory    Normal labor 11/17/2020   Supervision of normal first teen pregnancy 08/24/2020    Past Surgical History:  Procedure Laterality Date   NO PAST SURGERIES       reports that she has been smoking cigars. She has never been exposed to tobacco smoke. She has never used smokeless tobacco. She reports that she does not currently use alcohol. She reports current drug use. Drug: Marijuana.  Family History  Problem Relation Age of Onset   Anxiety disorder Mother     Migraines Sister    Anxiety disorder Sister    Migraines Sister    Migraines Maternal Grandmother    Anxiety disorder Maternal Grandfather      Physical Exam: Vitals:   11/04/23 0200 11/04/23 0215 11/04/23 0230 11/04/23 0245  BP: 134/73  129/70   Pulse: (!) 113 (!) 115 (!) 110 (!) 111  Resp: (!) 22 (!) 21 18 16   Temp:      TempSrc:      SpO2: 95% 95% 95% 95%  Weight:      Height:        Gen: Awake, alert, NAD  Neck: No meningismus   CV: Regular, tachycardic, normal S1, S2, no murmurs  Resp: Normal WOB, CTAB  Abd: Flat, normoactive, nontender MSK: Symmetric, no edema  Skin: No rashes or lesions to exposed skin  Neuro: Alert and interactive  Psych: euthymic, appropriate    Data review:   Labs reviewed, notable for:   Cr 2.5 (baseline 0.8)   Bicarb 15, AG 20  WBC 25  + THC, Benzo (given by EMS)    Micro:  Results for orders placed or performed during the hospital encounter of 11/03/23  Resp panel by RT-PCR (RSV, Flu A&B, Covid) Anterior Nasal Swab     Status: None   Collection Time: 11/03/23 10:52 PM   Specimen: Anterior Nasal Swab  Result Value Ref Range Status   SARS Coronavirus 2 by RT PCR NEGATIVE NEGATIVE Final   Influenza A by PCR NEGATIVE NEGATIVE Final   Influenza B  by PCR NEGATIVE NEGATIVE Final    Comment: (NOTE) The Xpert Xpress SARS-CoV-2/FLU/RSV plus assay is intended as an aid in the diagnosis of influenza from Nasopharyngeal swab specimens and should not be used as a sole basis for treatment. Nasal washings and aspirates are unacceptable for Xpert Xpress SARS-CoV-2/FLU/RSV testing.  Fact Sheet for Patients: BloggerCourse.com  Fact Sheet for Healthcare Providers: SeriousBroker.it  This test is not yet approved or cleared by the [place] FDA and has been authorized for detection and/or diagnosis of SARS-CoV-2 by FDA under an Emergency Use Authorization (EUA). This EUA will remain in  effect (meaning this test can be used) for the duration of the COVID-19 declaration under Section 564(b)(1) of the Act, 21 U.S.C. section 360bbb-3(b)(1), unless the authorization is terminated or revoked.     Resp Syncytial Virus by PCR NEGATIVE NEGATIVE Final    Comment: (NOTE) Fact Sheet for Patients: BloggerCourse.com  Fact Sheet for Healthcare Providers: SeriousBroker.it  This test is not yet approved or cleared by the [place] FDA and has been authorized for detection and/or diagnosis of SARS-CoV-2 by FDA under an Emergency Use Authorization (EUA). This EUA will remain in effect (meaning this test can be used) for the duration of the COVID-19 declaration under Section 564(b)(1) of the Act, 21 U.S.C. section 360bbb-3(b)(1), unless the authorization is terminated or revoked.  Performed at [facility], [address], [place], [place] [address]     Imaging reviewed:  CT Head Wo Contrast Result Date: 11/04/2023 CLINICAL DATA:  Mental status change, unknown cause EXAM: CT HEAD WITHOUT CONTRAST TECHNIQUE: Contiguous axial images were obtained from the base of the skull through the vertex without intravenous contrast. RADIATION DOSE REDUCTION: This exam was performed according to the departmental dose-optimization program which includes automated exposure control, adjustment of the mA and/or kV according to patient size and/or use of iterative reconstruction technique. COMPARISON:  None Available. FINDINGS: Streak artifact from the patient's hair implants limits assessment. Within this limitation: Brain: No evidence of acute infarction, hemorrhage, hydrocephalus, extra-axial collection or mass lesion/mass effect. Vascular: No hyperdense vessel identified. Skull: No acute fracture. Sinuses/Orbits: Clear sinuses.  No acute orbital findings. Other: No mastoid effusions. IMPRESSION: No evidence of acute intracranial abnormality.  Electronically Signed   By: Feliberto Harts M.D.   On: 11/04/2023 00:23   DG Chest Portable 1 View Result Date: 11/03/2023 CLINICAL DATA:  Cough and seizures EXAM: PORTABLE CHEST 1 VIEW COMPARISON:  06/30/2006 FINDINGS: The heart size and mediastinal contours are within normal limits. Both lungs are clear. The visualized skeletal structures are unremarkable. IMPRESSION: No active disease. Electronically Signed   By: Minerva Fester M.D.   On: 11/03/2023 22:37     ED Course:  Witnessed seizure by EMS, given Versed 5 mg en route. No additional seizures since this time. Neurology consulted in the ED and loaded with Keppra 1.5 g IV and has received home PO Lamotrigine as well. Post ictal status improving.     Assessment/Plan:  23 y.o. female with hx Epilepsy, who presented with multiple breakthrough seizures.   Breakthrough seizures,  Hx epilepsy  Total of ~10 tonic-clonic seizures throughout day. Treated with Versed 5 mg en route. No additional seizures since this time. Neurology consulted in the ED and loaded with Keppra 1.5 g IV and has received home PO Lamotrigine as well. Post ictal status improving. CT Head without acute findings. Trigger for seizures unclear may have underlying infection given her leukocytosis. Suspect AKI developing after seizure, does  have associated acidosis as well. Otherwise no identifiable predisposing factors  - Neurology following, AED management per their service.  Was loaded with Keppra 1.5 mg IV x 1 and continuing on home lamotrigine 200 mg twice daily. - Routine EEG pending, lamotrigine level pending - Seizure precautions  AKI stage III Anion gap acidosis Baseline creatinine 0.8, elevated to 2.5 on admission.  Associated with acidosis with bicarb of 15, anion gap 20.  Suspect acidosis likely in part lactic acidosis from recent seizure.  Nature of AKI prerenal with frequent seizures decreased intake and vomiting.  At risk of rhabdomyolysis with recurrent  seizures, may have intrinsic injury. - Check CK in setting of seizure episodes - S/p 1 L normal saline.  Given additional 1 L normal saline and continue oral hydration - Check lactate, VBG for AGMA  Leukocytosis No localizing infectious symptoms.  Question reactive in setting of breakthrough seizures versus possible GI illness. CXR normal. C/o headache and mild neck stiffness but no significant menigismus.  - Check UA, RVP. Low threshold for LP.  - Trend CBC   Body mass index is 23.04 kg/m.    DVT prophylaxis:  Lovenox Code Status:  Full Code Diet:  Diet Orders (From admission, onward)     Start     Ordered   11/04/23 0308  Diet regular Room service appropriate? Yes; Fluid consistency: Thin  Diet effective now       Question Answer Comment  Room service appropriate? Yes   Fluid consistency: Thin      11/04/23 0309           Family Communication:  Yes discussed with parents at bedside   Consults:  Neurology   Admission status:   Observation, Step Down Unit  Severity of Illness: The appropriate patient status for this patient is OBSERVATION. Observation status is judged to be reasonable and necessary in order to provide the required intensity of service to ensure the patient's safety. The patient's presenting symptoms, physical exam findings, and initial radiographic and laboratory data in the context of their medical condition is felt to place them at decreased risk for further clinical deterioration. Furthermore, it is anticipated that the patient will be medically stable for discharge from the hospital within 2 midnights of admission.    Dolly Rias, MD [facility]  How to contact the [facility] Attending or Consulting provider 7A - 7P or covering provider during after hours 7P -7A, for this patient.  Check the care team in [facility] and look for a) attending/consulting [facility] provider listed and b) the [facility] team listed Log into [email or web address] and use [place]'s universal  password to access. If you do not have the password, please contact the hospital operator. Locate the [facility] provider you are looking for under [facility] and page to a number that you can be directly reached. If you still have difficulty reaching the provider, please page the [facility] (Director on Call) for the Hospitalists listed on amion for assistance.  11/04/2023, 3:38 AM

## 2023-11-04 NOTE — Consult Note (Signed)
 NEUROLOGY CONSULT NOTE   Date of service: November 04, 2023 Patient Name: Michelle Horn MRN:  [id number] DOB:  12/04/2000 Chief Complaint: "cluster of seizures" Requesting Provider: Maia Plan, MD  History of Present Illness  Michelle Horn is a 23 y.o. female with hx of epilepsy, iron deficiency anemia, who presents with a cluster of seizures.  Her mother and father are at the bedside, patient is somnolent and agitated.  She follows with Dr. Teresa Coombs with [facility].  She woke up at 4AM on 11/03/23 and felt an aura. She then proceeded to have over 9 seizures in a period of few hours. She is compliant with her Lamotrigine 200mg  BID. She tried taking her AM dose but then threw up. She had a similar cluster of seizures back in nov 2024. It is common for her to throw up and have a bad headache after seizure.  She is waking up and moving purposefully. She is oriented to self and can do simple calculations. No clinical seizure activity noted.  She was loaded with Keppra 20mg /Kg in the ED.  ROS  Unable to ascertain due to somnolence  Past History   Past Medical History:  Diagnosis Date   COVID-19 affecting pregnancy, antepartum 08/24/2020   Epilepsy (HCC)    Group beta Strep positive 11/17/2020   Iron deficiency anemia of pregnancy 10/23/2020   Medical history non-contributory    Normal labor 11/17/2020   Supervision of normal first teen pregnancy 08/24/2020    Past Surgical History:  Procedure Laterality Date   NO PAST SURGERIES      Family History: Family History  Problem Relation Age of Onset   Anxiety disorder Mother    Migraines Sister    Anxiety disorder Sister    Migraines Sister    Migraines Maternal Grandmother    Anxiety disorder Maternal Grandfather     Social History  reports that she has been smoking cigars. She has never been exposed to tobacco smoke. She has never used smokeless tobacco. She reports that she does not currently use alcohol. She reports  current drug use. Drug: Marijuana.  No Known Allergies  Medications   Current Facility-Administered Medications:    midazolam (VERSED) injection 4 mg, 4 mg, Intravenous, Once PRN, Long, Arlyss Repress, MD  Current Outpatient Medications:    lamoTRIgine (LAMICTAL) 200 MG tablet, Take 1 tablet (200 mg total) by mouth 2 (two) times daily., Disp: 180 tablet, Rfl: 3  Vitals   Vitals:   11/03/23 2315 11/03/23 2330 11/04/23 0000 11/04/23 0015  BP: 134/83 133/83 (!) 140/81 137/78  Pulse: (!) 106  (!) 107 (!) 106  Resp: 20 19 16 14   Temp:      TempSrc:      SpO2: 99%  92% 94%  Weight:      Height:        Body mass index is 23.04 kg/m.  Physical Exam   General: rerpoting significant headache in bed; in no acute distress. HENT: Normal oropharynx and mucosa. Normal external appearance of ears and nose.  Neck: Supple, no pain or tenderness  CV: No JVD. No peripheral edema.  Pulmonary: Symmetric Chest rise. Normal respiratory effort.  Abdomen: Soft to touch, non-tender.  Ext: No cyanosis, edema, or deformity  Skin: No rash. Normal palpation of skin.   Musculoskeletal: Normal digits and nails by inspection. No clubbing.   Neurologic Examination  Mental status/Cognition: drowsy with psychomotor slowing, oriented to self, place, month and year, good attention.  Speech/language: Fluent, comprehension intact,  object naming intact, repetition intact.  Cranial nerves:   CN II Pupils equal and reactive to light, no VF deficits    CN III,IV,VI EOM intact, no gaze preference or deviation, no nystagmus    CN V normal sensation in V1, V2, and V3 segments bilaterally    CN VII no asymmetry, no nasolabial fold flattening    CN VIII normal hearing to speech    CN IX & X normal palatal elevation, no uvular deviation    CN XI 5/5 head turn and 5/5 shoulder shrug bilaterally    CN XII midline tongue protrusion    Motor:  Muscle bulk: normal, tone normal, pronator drift non tremor none Moves all  extremities spontaneously and antigravity. No focal weakness.  Sensation:  Light touch Intact throughout   Pin prick    Temperature    Vibration   Proprioception    Coordination/Complex Motor:  - Finger to Nose intact BL - Heel to shin unable to do due to somnolence - Rapid alternating movement are slowed throughout - Gait: deferred.   Labs/Imaging/Neurodiagnostic studies   CBC:  Recent Labs  Lab 11-18-2023 2111  WBC 25.3*  NEUTROABS 21.0*  HGB 14.5  HCT 44.3  MCV 94.9  PLT 348   Basic Metabolic Panel:  Lab Results  Component Value Date   NA 136 11-18-2023   K 4.0 11-18-23   CO2 15 (L) 11/18/2023   GLUCOSE 168 (H) Nov 18, 2023   BUN 19 18-Nov-2023   CREATININE 2.57 (H) 2023/11/18   CALCIUM 9.6 2023/11/18   GFRNONAA 27 (L) 11/18/23   GFRAA CANCELED 02/25/2020   Lipid Panel: No results found for: "LDLCALC" HgbA1c: No results found for: "HGBA1C" Urine Drug Screen:     Component Value Date/Time   LABOPIA NONE DETECTED 11/18/23 2112   COCAINSCRNUR NONE DETECTED November 18, 2023 2112   COCAINSCRNUR Negative 06/12/2021 1956   LABBENZ POSITIVE (A) 11/18/23 2112   AMPHETMU NONE DETECTED November 18, 2023 2112   THCU POSITIVE (A) Nov 18, 2023 2112   LABBARB NONE DETECTED Nov 18, 2023 2112    Alcohol Level     Component Value Date/Time   ETH <10 11-18-23 2111   INR No results found for: "INR" APTT No results found for: "APTT" AED levels:  Lab Results  Component Value Date   LAMOTRIGINE 5.1 04/03/2023    CT Head without contrast(Personally reviewed): CTH was negative for a large hypodensity concerning for a large territory infarct or hyperdensity concerning for an ICH  Neurodiagnostics rEEG:  Pending  CT head pending.  ASSESSMENT   Michelle Horn is a 23 y.o. female with hx of epilepsy, compliant with meds, had cluster of seizures, over 9 in a period of few hours and now post ictal and sedated from versed.  No clear provoking factors identified. She is following  commands and no clinical concern for persistent seizures.  RECOMMENDATIONS  - Agree with Keppra load of 20mg /Kg - increase home Lamotrigine to 250mg  BID - observe overnight to monitor for further seizures. - CT head and routine EEG pending. ______________________________________________________________________    Michelle Flakes, MD [facility]

## 2023-11-04 NOTE — ED Notes (Signed)
 Pt return from CT scanner, NAD noted, family remains at bedside.

## 2023-11-04 NOTE — Plan of Care (Signed)

## 2023-11-04 NOTE — Plan of Care (Signed)

## 2023-11-04 NOTE — ED Notes (Signed)
 Unit advised that pt is on their way up to unit by RN Tiffany. NAD noted. Delay in transport as primary RN was with a critical patient. Family notified pt is ready for transports.

## 2023-11-05 ENCOUNTER — Encounter: Payer: Self-pay | Admitting: Neurology

## 2023-11-05 DIAGNOSIS — R569 Unspecified convulsions: Secondary | ICD-10-CM | POA: Diagnosis not present

## 2023-11-05 DIAGNOSIS — G40919 Epilepsy, unspecified, intractable, without status epilepticus: Secondary | ICD-10-CM | POA: Diagnosis not present

## 2023-11-05 LAB — CK: Total CK: 522 U/L — ABNORMAL HIGH (ref 38–234)

## 2023-11-05 LAB — CBC
HCT: 38.6 % (ref 36.0–46.0)
Hemoglobin: 13.2 g/dL (ref 12.0–15.0)
MCH: 31.2 pg (ref 26.0–34.0)
MCHC: 34.2 g/dL (ref 30.0–36.0)
MCV: 91.3 fL (ref 80.0–100.0)
Platelets: 283 10*3/uL (ref 150–400)
RBC: 4.23 MIL/uL (ref 3.87–5.11)
RDW: 12.8 % (ref 11.5–15.5)
WBC: 8.5 10*3/uL (ref 4.0–10.5)
nRBC: 0 % (ref 0.0–0.2)

## 2023-11-05 LAB — COMPREHENSIVE METABOLIC PANEL WITH GFR
ALT: 16 U/L (ref 0–44)
AST: 22 U/L (ref 15–41)
Albumin: 4.1 g/dL (ref 3.5–5.0)
Alkaline Phosphatase: 64 U/L (ref 38–126)
Anion gap: 12 (ref 5–15)
BUN: 12 mg/dL (ref 6–20)
CO2: 20 mmol/L — ABNORMAL LOW (ref 22–32)
Calcium: 9.4 mg/dL (ref 8.9–10.3)
Chloride: 105 mmol/L (ref 98–111)
Creatinine, Ser: 1.17 mg/dL — ABNORMAL HIGH (ref 0.44–1.00)
GFR, Estimated: >60 mL/min (ref >60)
Glucose, Bld: 93 mg/dL (ref 70–99)
Potassium: 3.8 mmol/L (ref 3.5–5.1)
Sodium: 137 mmol/L (ref 135–145)
Total Bilirubin: 0.8 mg/dL (ref 0.0–1.2)
Total Protein: 7.1 g/dL (ref 6.5–8.1)

## 2023-11-05 LAB — PHOSPHORUS: Phosphorus: 3.5 mg/dL (ref 2.5–4.6)

## 2023-11-05 LAB — MAGNESIUM: Magnesium: 2.4 mg/dL (ref 1.7–2.4)

## 2023-11-05 MED ORDER — ONDANSETRON HCL 4 MG PO TABS: 4.0000 mg | ORAL_TABLET | Freq: Three times a day (TID) | ORAL | 0 refills | Status: AC | PRN

## 2023-11-05 MED ORDER — LAMOTRIGINE 25 MG PO TABS: 250.0000 mg | ORAL_TABLET | Freq: Two times a day (BID) | ORAL | 0 refills | Status: DC

## 2023-11-05 NOTE — Plan of Care (Signed)

## 2023-11-05 NOTE — Progress Notes (Signed)
 Neurology Progress/Same Day Note  Brief HPI: 23 year old female with history of epilepsy following with Dr. Teresa Coombs at [facility] compliant on home lamotrigine 200 twice daily, IDA, presenting with cluster seizures.  On 11/03/2023 patient woke with an aura followed by 9 seizures over a period of a few hours and vomited with her AM dose of lamotrigine.  Patient with reported similar seizure cluster in November 2024 though current presentation is more dramatic per family at bedside.  Patient was given 5 mg of Versed by EMS and loaded with 20 mg/kg of Keppra in the ED, her LMTG dose was increased from 200 mg BID to 250 mg BID and she was admitted for further observation.  Subjective: Patient at baseline neurologic status, resolved headache from complaints yesterday.  Mom did note that patient had an episode of emesis last night but she is feeling much better this morning.  No seizures reported since hospital arrival. Leukocytosis resolved (likely reactive), AKI improved. CK elevation persistent 352 -> 522  Exam: Vitals:   11/04/23 2300 11/05/23 0400  BP: (!) 106/52 111/66  Pulse: 82   Resp: 16 18  Temp: 98.3 F (36.8 C) 98.5 F (36.9 C)  SpO2: 100% 99%   Gen: Laying in hospital bed, in no acute distress Resp: non-labored breathing, no respiratory distress on room air Abd: soft, nt  Neuro: Mental Status: Patient is awake and alert. She is oriented throughout and follows commands.  Speech is intact without dysarthria or aphasia  Cranial Nerves: PERRL, EOMI, VFF, face symmetric, facial sensation intact and symmetric to light touch, hearing is intact to voice, shoulder shrug is symmetric, tongue is midline Motor: Moves all extremities spontaneously and antigravity without unilateral weakness or asymmetry.  Sensory: Sensation intact and symmetric to light touch throughout.   Gait: Deferred  Pertinent Labs: CBC    Component Value Date/Time   WBC 8.5 11/05/2023 0626   RBC 4.23 11/05/2023 0626   HGB  13.2 11/05/2023 0626   HGB 10.7 (L) 09/08/2020 1709   HCT 38.6 11/05/2023 0626   HCT 31.4 (L) 09/08/2020 1709   PLT 283 11/05/2023 0626   PLT 286 09/08/2020 1709   MCV 91.3 11/05/2023 0626   MCV 90 09/08/2020 1709   MCH 31.2 11/05/2023 0626   MCHC 34.2 11/05/2023 0626   RDW 12.8 11/05/2023 0626   RDW 12.2 09/08/2020 1709   LYMPHSABS 1.3 11/03/2023 2111   LYMPHSABS 2.1 04/15/2020 1029   MONOABS 2.8 (H) 11/03/2023 2111   EOSABS 0.3 11/03/2023 2111   EOSABS 0.3 04/15/2020 1029   BASOSABS 0.0 11/03/2023 2111   BASOSABS 0.1 04/15/2020 1029   CMP     Component Value Date/Time   NA 137 11/05/2023 0626   NA 141 04/03/2023 0814   K 3.8 11/05/2023 0626   CL 105 11/05/2023 0626   CO2 20 (L) 11/05/2023 0626   GLUCOSE 93 11/05/2023 0626   BUN 12 11/05/2023 0626   BUN 9 04/03/2023 0814   CREATININE 1.17 (H) 11/05/2023 0626   CALCIUM 9.4 11/05/2023 0626   PROT 7.1 11/05/2023 0626   PROT 7.0 04/03/2023 0814   ALBUMIN 4.1 11/05/2023 0626   ALBUMIN 4.7 04/03/2023 0814   AST 22 11/05/2023 0626   ALT 16 11/05/2023 0626   ALKPHOS 64 11/05/2023 0626   BILITOT 0.8 11/05/2023 0626   BILITOT 0.3 04/03/2023 0814   EGFR 109 04/03/2023 0814   GFRNONAA >60 11/05/2023 0626   Drugs of Abuse     Component Value Date/Time   LABOPIA  NONE DETECTED 11/03/2023 2112   COCAINSCRNUR NONE DETECTED 11/03/2023 2112   COCAINSCRNUR Negative 06/12/2021 1956   LABBENZ POSITIVE (A) 11/03/2023 2112   AMPHETMU NONE DETECTED 11/03/2023 2112   THCU POSITIVE (A) 11/03/2023 2112   LABBARB NONE DETECTED 11/03/2023 2112   Imaging Reviewed:  CT Head: No evidence of acute intracranial abnormality.   Assessment: 23 year old female with history of epilepsy compliant with home lamotrigine presenting with breakthrough cluster seizures with reported 9 seizures PTA. No seizures since hospitalization.  Patient was loaded with 20 mg/kg of Keppra and home lamotrigine was increased from 200 mg twice daily to 250 mg twice  daily.   - Exam reveals patient is back to her baseline neurologic status without further seizures since admission.  - Inpatient work up: Routine EEG within normal limits without seizures or epileptiform discharges. CT head negative.  UDS positive for THC. Leukocytosis without fever with WBC 25.3 -> 17.1-> 8.5, likely reactive AKI resolved.  - Presentation is consistent with breakthrough seizures with clustering of seizures on presentation.  Last seizure reported as cluster seizures in November of 2024.   Impression:  Breakthrough cluster seizures  Recommendations: - Continue increased dose of home LMTG at 250 mg BID - Follow up with outpatient neurologist for possible rescue benzo. Patient may benefit from PO alternate route rescue benzodiazepine for cluster seizures (with frequent associated vomiting) though inpatient/TOC pharmacy is unable to complete pre-authorization over the weekend for Valtoco.   - No further inpatient neurology recommendations at this time, recommend close follow up with outpatient neurologist.   Seizure precautions: Per [facility] statutes, patients with seizures are not allowed to drive until they have been seizure-free for six months and cleared by a physician    Use caution when using heavy equipment or power tools. Avoid working on ladders or at heights. Take showers instead of baths. Ensure the water temperature is not too high on the home water heater. Do not go swimming alone. Do not lock yourself in a room alone (i.e. bathroom). When caring for infants or small children, sit down when holding, feeding, or changing them to minimize risk of injury to the child in the event you have a seizure. Maintain good sleep hygiene. Avoid alcohol.    If patient has another seizure, call 911 and bring them back to the ED if: A.  The seizure lasts longer than 5 minutes.      B.  The patient doesn't wake shortly after the seizure or has new problems such as difficulty  seeing, speaking or moving following the seizure C.  The patient was injured during the seizure D.  The patient has a temperature over 102 F (39C) E.  The patient vomited during the seizure and now is having trouble breathing    During the Seizure   - First, ensure adequate ventilation and place patients on the floor on their left side  Loosen clothing around the neck and ensure the airway is patent. If the patient is clenching the teeth, do not force the mouth open with any object as this can cause severe damage - Remove all items from the surrounding that can be hazardous. The patient may be oblivious to what's happening and may not even know what he or she is doing. If the patient is confused and wandering, either gently guide him/her away and block access to outside areas - Reassure the individual and be comforting - Call 911. In most cases, the seizure ends before EMS arrives. However,  there are cases when seizures may last over 3 to 5 minutes. Or the individual may have developed breathing difficulties or severe injuries. If a pregnant patient or a person with diabetes develops a seizure, it is prudent to call an ambulance. - Finally, if the patient does not regain full consciousness, then call EMS. Most patients will remain confused for about 45 to 90 minutes after a seizure, so you must use judgment in calling for help. - Avoid restraints but make sure the patient is in a bed with padded side rails - Place the individual in a lateral position with the neck slightly flexed; this will help the saliva drain from the mouth and prevent the tongue from falling backward - Remove all nearby furniture and other hazards from the area - Provide verbal assurance as the individual is regaining consciousness - Provide the patient with privacy if possible - Call for help and start treatment as ordered by the caregiver    After the Seizure (Postictal Stage)   After a seizure, most patients experience  confusion, fatigue, muscle pain and/or a headache. Thus, one should permit the individual to sleep. For the next few days, reassurance is essential. Being calm and helping reorient the person is also of importance.   Most seizures are painless and end spontaneously. Seizures are not harmful to others but can lead to complications such as stress on the lungs, brain and the heart. Individuals with prior lung problems may develop labored breathing and respiratory distress.   Discussed with attending MD who is in agreement.   Lanae Boast, AGACNP-BC [facility] [phone]  Attending Neurologist's note:  Patient discussed with me but left before I could see the patient.  Plan discussed by myself with primary team  Brooke Dare MD-PhD [facility] [phone] Available 7 AM to 7 PM, outside these hours please contact Neurologist on call listed on AMION

## 2023-11-05 NOTE — Progress Notes (Signed)
 Piv dcd stie unremarkable.all belongings and paperwork given to patient.

## 2023-11-05 NOTE — Discharge Summary (Signed)
 Physician Discharge Summary   Patient: Michelle Horn MRN: [id number] DOB: [birth year]  Admit date:     11/03/2023  Discharge date: 11/05/23  Discharge Physician: Baron Hamper    PCP: Billey Co, MD     Discharge Diagnoses: Principal Problem:   Breakthrough seizure [facility])  Resolved Problems:   * No resolved hospital problems. *  Hospital Course: 23 yo F evaluated for cluster seizure. Neurology was consulted. Neurology increased the pt's lamotrigine from 200 mg PO bid to 250 mg PO bid here in the hospital. Pt was also noted to have AKI and this resolved with IV fluids. Her leukocytosis also resolved and was suspected to be reactive for her numerous seizures. Pt will have close follow up with her outpt neurologist. Our outpt pharmacy will work towards obtaining approval for valtoco on Mon 11/06/2023.   Spoke with the pt's pharmacy [facility] on the telephone and the lamotrigine will be dosed 250 mg PO bid in the following manner:  - 1 script for lamotrigine 100 mg PO bid  - 1 script for lamotrigine 150 mg PO bid   No driving due to seizures as noted above.    DISCHARGE MEDICATION: Allergies as of 11/05/2023   No Known Allergies      Medication List     TAKE these medications    lamoTRIgine 25 MG tablet Commonly known as: LAMICTAL Take 10 tablets (250 mg total) by mouth 2 (two) times daily. What changed:  medication strength how much to take   ondansetron 4 MG tablet Commonly known as: Zofran Take 1 tablet (4 mg total) by mouth every 8 (eight) hours as needed for up to 7 days for nausea or vomiting.        Discharge Exam: Filed Weights   11/03/23 2209  Weight: 59 kg   Physical Exam HENT:     Head: Normocephalic.     Mouth/Throat:     Mouth: Mucous membranes are moist.  Cardiovascular:     Rate and Rhythm: Normal rate and regular rhythm.  Pulmonary:     Effort: Pulmonary effort is normal.  Abdominal:     Palpations: Abdomen is soft.  Musculoskeletal:         General: Normal range of motion.     Cervical back: Neck supple.  Skin:    General: Skin is warm.  Neurological:     Mental Status: She is alert and oriented to person, place, and time.  Psychiatric:        Mood and Affect: Mood normal.      Condition at discharge: fair  The results of significant diagnostics from this hospitalization (including imaging, microbiology, ancillary and laboratory) are listed below for reference.   Imaging Studies: EEG adult Result Date: 11/05/2023 Charlsie Quest, MD     11/05/2023  7:50 AM Patient Name: Michelle Horn MRN: [id number] Epilepsy Attending: Charlsie Quest Referring Physician/Provider: Erick Blinks, MD Date: 11/04/2023 Duration: 22.25 mins Patient history:  23 year old female with history of epilepsy compliant with home lamotrigine presenting with breakthrough cluster seizures with reported 9 seizures PTA. EEG to evaluate for seizure Level of alertness: Awake, asleep AEDs during EEG study: LTG Technical aspects: This EEG study was done with scalp electrodes positioned according to the 10-20 International system of electrode placement. Electrical activity was reviewed with band pass filter of 1-70Hz , sensitivity of 7 uV/mm, display speed of 45mm/sec with a 60Hz  notched filter applied as appropriate. EEG data were recorded continuously and digitally stored.  Video monitoring  was available and reviewed as appropriate. Description: The posterior dominant rhythm consists of 8 Hz activity of moderate voltage (25-35 uV) seen predominantly in posterior head regions, symmetric and reactive to eye opening and eye closing. Sleep was characterized by vertex waves, sleep spindles (12 to 14 Hz), maximal frontocentral region.  Physiologic photic driving was not seen during photic stimulation.  Hyperventilation was not performed.   IMPRESSION: This study is within normal limits. No seizures or epileptiform discharges were seen throughout the recording. A normal  interictal EEG does not exclude the diagnosis of epilepsy. Charlsie Quest   CT Head Wo Contrast Result Date: 11/04/2023 CLINICAL DATA:  Mental status change, unknown cause EXAM: CT HEAD WITHOUT CONTRAST TECHNIQUE: Contiguous axial images were obtained from the base of the skull through the vertex without intravenous contrast. RADIATION DOSE REDUCTION: This exam was performed according to the departmental dose-optimization program which includes automated exposure control, adjustment of the mA and/or kV according to patient size and/or use of iterative reconstruction technique. COMPARISON:  None Available. FINDINGS: Streak artifact from the patient's hair implants limits assessment. Within this limitation: Brain: No evidence of acute infarction, hemorrhage, hydrocephalus, extra-axial collection or mass lesion/mass effect. Vascular: No hyperdense vessel identified. Skull: No acute fracture. Sinuses/Orbits: Clear sinuses.  No acute orbital findings. Other: No mastoid effusions. IMPRESSION: No evidence of acute intracranial abnormality. Electronically Signed   By: Feliberto Harts M.D.   On: 11/04/2023 00:23   DG Chest Portable 1 View Result Date: 11/03/2023 CLINICAL DATA:  Cough and seizures EXAM: PORTABLE CHEST 1 VIEW COMPARISON:  06/30/2006 FINDINGS: The heart size and mediastinal contours are within normal limits. Both lungs are clear. The visualized skeletal structures are unremarkable. IMPRESSION: No active disease. Electronically Signed   By: Minerva Fester M.D.   On: 11/03/2023 22:37    Microbiology: Results for orders placed or performed during the hospital encounter of 11/03/23  Resp panel by RT-PCR (RSV, Flu A&B, Covid) Anterior Nasal Swab     Status: None   Collection Time: 11/03/23 10:52 PM   Specimen: Anterior Nasal Swab  Result Value Ref Range Status   SARS Coronavirus 2 by RT PCR NEGATIVE NEGATIVE Final   Influenza A by PCR NEGATIVE NEGATIVE Final   Influenza B by PCR NEGATIVE  NEGATIVE Final    Comment: (NOTE) The Xpert Xpress SARS-CoV-2/FLU/RSV plus assay is intended as an aid in the diagnosis of influenza from Nasopharyngeal swab specimens and should not be used as a sole basis for treatment. Nasal washings and aspirates are unacceptable for Xpert Xpress SARS-CoV-2/FLU/RSV testing.  Fact Sheet for Patients: BloggerCourse.com  Fact Sheet for Healthcare Providers: SeriousBroker.it  This test is not yet approved or cleared by the [place] FDA and has been authorized for detection and/or diagnosis of SARS-CoV-2 by FDA under an Emergency Use Authorization (EUA). This EUA will remain in effect (meaning this test can be used) for the duration of the COVID-19 declaration under Section 564(b)(1) of the Act, 21 U.S.C. section 360bbb-3(b)(1), unless the authorization is terminated or revoked.     Resp Syncytial Virus by PCR NEGATIVE NEGATIVE Final    Comment: (NOTE) Fact Sheet for Patients: BloggerCourse.com  Fact Sheet for Healthcare Providers: SeriousBroker.it  This test is not yet approved or cleared by the [place] FDA and has been authorized for detection and/or diagnosis of SARS-CoV-2 by FDA under an Emergency Use Authorization (EUA). This EUA will remain in effect (meaning this test can be used) for the duration of the COVID-19  declaration under Section 564(b)(1) of the Act, 21 U.S.C. section 360bbb-3(b)(1), unless the authorization is terminated or revoked.  Performed at [facility], [address], [place], [place] [address]   Respiratory (~20 pathogens) panel by PCR     Status: None   Collection Time: 11/04/23  3:16 AM   Specimen: Nasopharyngeal Swab; Respiratory  Result Value Ref Range Status   Adenovirus NOT DETECTED NOT DETECTED Final   Coronavirus 229E NOT DETECTED NOT DETECTED Final    Comment: (NOTE) The Coronavirus on  the Respiratory Panel, DOES NOT test for the novel  Coronavirus (2019 nCoV)    Coronavirus HKU1 NOT DETECTED NOT DETECTED Final   Coronavirus NL63 NOT DETECTED NOT DETECTED Final   Coronavirus OC43 NOT DETECTED NOT DETECTED Final   Metapneumovirus NOT DETECTED NOT DETECTED Final   Rhinovirus / Enterovirus NOT DETECTED NOT DETECTED Final   Influenza A NOT DETECTED NOT DETECTED Final   Influenza B NOT DETECTED NOT DETECTED Final   Parainfluenza Virus 1 NOT DETECTED NOT DETECTED Final   Parainfluenza Virus 2 NOT DETECTED NOT DETECTED Final   Parainfluenza Virus 3 NOT DETECTED NOT DETECTED Final   Parainfluenza Virus 4 NOT DETECTED NOT DETECTED Final   Respiratory Syncytial Virus NOT DETECTED NOT DETECTED Final   Bordetella pertussis NOT DETECTED NOT DETECTED Final   Bordetella Parapertussis NOT DETECTED NOT DETECTED Final   Chlamydophila pneumoniae NOT DETECTED NOT DETECTED Final   Mycoplasma pneumoniae NOT DETECTED NOT DETECTED Final    Comment: Performed at [facility], [address], [place], [place] [address]    Labs: CBC: Recent Labs  Lab 11/03/23 2111 11/04/23 0351 11/05/23 0626  WBC 25.3* 17.1* 8.5  NEUTROABS 21.0*  --   --   HGB 14.5 13.0 13.2  HCT 44.3 39.1 38.6  MCV 94.9 94.0 91.3  PLT 348 297 283   Basic Metabolic Panel: Recent Labs  Lab 11/03/23 2111 11/04/23 0351 11/05/23 0626  NA 136 135 137  K 4.0 4.5 3.8  CL 101 107 105  CO2 15* 15* 20*  GLUCOSE 168* 100* 93  BUN 19 19 12   CREATININE 2.57* 1.93* 1.17*  CALCIUM 9.6 9.0 9.4  MG  --  2.7* 2.4  PHOS  --  6.0* 3.5   Liver Function Tests: Recent Labs  Lab 11/03/23 2111 11/05/23 0626  AST 32 22  ALT 22 16  ALKPHOS 90 64  BILITOT 0.7 0.8  PROT 8.1 7.1  ALBUMIN 5.1* 4.1   CBG: Recent Labs  Lab 11/03/23 2108  GLUCAP 160*    Discharge time spent: greater than 30 minutes.  Signed: Baron Hamper , MD [facility] 11/05/2023

## 2023-11-05 NOTE — Discharge Instructions (Signed)
 To control seizures, your medications have been adjusted as follows:  Lamotrigine increased to 250 mg twice daily (previously 200 mg twice daily)   Please keep track of any other spells you have, including shaking spells, loss of consciousness events, staring spells etc to review with your neurologist   Spell log:  - Date and time of event: - Description of event:  - Prodome (any warning / premonition event is going to happen): - Post-spell symptoms: - Any potential triggers:   Standard seizure precautions: Per [employer] statutes, patients with seizures are not allowed to drive until  they have been seizure-free for six months. Use caution when using heavy equipment or power tools. Avoid working on ladders or at heights. Take showers instead of baths. Ensure the water temperature is not too high on the home water heater. Do not go swimming alone. When caring for infants or small children, sit down when holding, feeding, or changing them to minimize risk of injury to the child in the event you have a seizure.  To reduce risk of seizures, maintain good sleep hygiene avoid alcohol and illicit drug use, take all anti-seizure medications as prescribed.

## 2023-11-05 NOTE — Procedures (Signed)
 Patient Name: Michelle Horn  MRN: [id number]  Epilepsy Attending: Charlsie Quest  Referring Physician/Provider: Erick Blinks, MD  Date: 11/04/2023 Duration: 22.25 mins  Patient history:  23 year old female with history of epilepsy compliant with home lamotrigine presenting with breakthrough cluster seizures with reported 9 seizures PTA. EEG to evaluate for seizure  Level of alertness: Awake, asleep  AEDs during EEG study: LTG  Technical aspects: This EEG study was done with scalp electrodes positioned according to the 10-20 International system of electrode placement. Electrical activity was reviewed with band pass filter of 1-70Hz , sensitivity of 7 uV/mm, display speed of 4mm/sec with a 60Hz  notched filter applied as appropriate. EEG data were recorded continuously and digitally stored.  Video monitoring was available and reviewed as appropriate.  Description: The posterior dominant rhythm consists of 8 Hz activity of moderate voltage (25-35 uV) seen predominantly in posterior head regions, symmetric and reactive to eye opening and eye closing. Sleep was characterized by vertex waves, sleep spindles (12 to 14 Hz), maximal frontocentral region.  Physiologic photic driving was not seen during photic stimulation.  Hyperventilation was not performed.     IMPRESSION: This study is within normal limits. No seizures or epileptiform discharges were seen throughout the recording.  A normal interictal EEG does not exclude the diagnosis of epilepsy.  Fabrice Dyal [name]

## 2023-11-06 ENCOUNTER — Other Ambulatory Visit: Payer: Self-pay

## 2023-11-06 ENCOUNTER — Telehealth: Payer: Self-pay

## 2023-11-06 LAB — LAMOTRIGINE LEVEL: Lamotrigine Lvl: 8.5 ug/mL (ref 2.0–20.0)

## 2023-11-06 MED ORDER — VALTOCO 15 MG DOSE 2 X 7.5 MG/0.1ML NA LQPK: NASAL | 3 refills | Status: AC

## 2023-11-06 NOTE — Progress Notes (Signed)
 Mom not giving the 25mg  pill due to quantity

## 2023-11-06 NOTE — Telephone Encounter (Signed)
 Call to mother and she reports Friday 11/03/23 morning patient having a grand mal seizure at 4 am then continued to have 3 more about 1 hour between each.  EMS was called Friday night and she was given a "shot" to stop the seizure and IV keppra in the hospital. Patietn DOES NOT have rescue medication. Mom reports medication compliance that she know of, she does report marijuana use and rarely uses alcohol. Mom does report sleep deprivation. Denies any other seizure triggers. I reviewed seizure triggers, safety precautions and rescue medication use. She reports lamictal was increased to 250 mg twice daily. Mediation list updated to reflect medications doses that patient has in the home. Mom would like to have EEG in EMU if Dr. Teresa Coombs is going to order one and she is going to see if patient is eligible for FMLA due to part time work.

## 2023-11-06 NOTE — Telephone Encounter (Signed)
Letter sent via my chart and mailed to patient.

## 2023-11-08 NOTE — Telephone Encounter (Signed)
 Deleted letter draft

## 2024-01-06 ENCOUNTER — Other Ambulatory Visit: Payer: Self-pay

## 2024-01-06 ENCOUNTER — Emergency Department (HOSPITAL_COMMUNITY)
Admission: EM | Admit: 2024-01-06 | Discharge: 2024-01-06 | Disposition: A | Discharge status: Home / Self Care | Attending: Emergency Medicine | Admitting: Emergency Medicine

## 2024-01-06 DIAGNOSIS — R739 Hyperglycemia, unspecified: Secondary | ICD-10-CM | POA: Insufficient documentation

## 2024-01-06 DIAGNOSIS — R0689 Other abnormalities of breathing: Secondary | ICD-10-CM | POA: Diagnosis not present

## 2024-01-06 DIAGNOSIS — R569 Unspecified convulsions: Secondary | ICD-10-CM | POA: Insufficient documentation

## 2024-01-06 DIAGNOSIS — R404 Transient alteration of awareness: Secondary | ICD-10-CM | POA: Diagnosis not present

## 2024-01-06 DIAGNOSIS — D72829 Elevated white blood cell count, unspecified: Secondary | ICD-10-CM | POA: Diagnosis not present

## 2024-01-06 DIAGNOSIS — R55 Syncope and collapse: Secondary | ICD-10-CM | POA: Diagnosis not present

## 2024-01-06 LAB — COMPREHENSIVE METABOLIC PANEL WITH GFR
ALT: 18 U/L (ref 0–44)
AST: 34 U/L (ref 15–41)
Albumin: 4.9 g/dL (ref 3.5–5.0)
Alkaline Phosphatase: 77 U/L (ref 38–126)
Anion gap: 29 — ABNORMAL HIGH (ref 5–15)
BUN: 12 mg/dL (ref 6–20)
CO2: 9 mmol/L — ABNORMAL LOW (ref 22–32)
Calcium: 9.6 mg/dL (ref 8.9–10.3)
Chloride: 98 mmol/L (ref 98–111)
Creatinine, Ser: 1.33 mg/dL — ABNORMAL HIGH (ref 0.44–1.00)
GFR, Estimated: 59 mL/min — ABNORMAL LOW (ref >60)
Glucose, Bld: 203 mg/dL — ABNORMAL HIGH (ref 70–99)
Potassium: 4.4 mmol/L (ref 3.5–5.1)
Sodium: 136 mmol/L (ref 135–145)
Total Bilirubin: 0.4 mg/dL (ref 0.0–1.2)
Total Protein: 8 g/dL (ref 6.5–8.1)

## 2024-01-06 LAB — CBC WITH DIFFERENTIAL/PLATELET
Abs Immature Granulocytes: 0.23 10*3/uL — ABNORMAL HIGH (ref 0.00–0.07)
Basophils Absolute: 0.1 10*3/uL (ref 0.0–0.1)
Basophils Relative: 1 %
Eosinophils Absolute: 0.2 10*3/uL (ref 0.0–0.5)
Eosinophils Relative: 1 %
HCT: 46.4 % — ABNORMAL HIGH (ref 36.0–46.0)
Hemoglobin: 14.7 g/dL (ref 12.0–15.0)
Immature Granulocytes: 2 %
Lymphocytes Relative: 30 %
Lymphs Abs: 4.5 10*3/uL — ABNORMAL HIGH (ref 0.7–4.0)
MCH: 31.5 pg (ref 26.0–34.0)
MCHC: 31.7 g/dL (ref 30.0–36.0)
MCV: 99.4 fL (ref 80.0–100.0)
Monocytes Absolute: 1 10*3/uL (ref 0.1–1.0)
Monocytes Relative: 7 %
Neutro Abs: 8.9 10*3/uL — ABNORMAL HIGH (ref 1.7–7.7)
Neutrophils Relative %: 59 %
Platelets: 356 10*3/uL (ref 150–400)
RBC: 4.67 MIL/uL (ref 3.87–5.11)
RDW: 12.2 % (ref 11.5–15.5)
WBC: 14.9 10*3/uL — ABNORMAL HIGH (ref 4.0–10.5)
nRBC: 0 % (ref 0.0–0.2)

## 2024-01-06 LAB — CBG MONITORING, ED: Glucose-Capillary: 177 mg/dL — ABNORMAL HIGH (ref 70–99)

## 2024-01-06 MED ORDER — LEVETIRACETAM 500 MG PO TABS
500.0000 mg | ORAL_TABLET | Freq: Two times a day (BID) | ORAL | 1 refills | Status: DC
Start: 2024-01-06 — End: 2024-02-01

## 2024-01-06 MED ORDER — VALTOCO 15 MG DOSE 2 X 7.5 MG/0.1ML NA LQPK: NASAL | 1 refills | Status: DC

## 2024-01-06 MED ORDER — LEVETIRACETAM (KEPPRA) 500 MG/5 ML ADULT IV PUSH
1000.0000 mg | Freq: Once | INTRAVENOUS | Status: AC
  Administered 2024-01-06: 1000 mg via INTRAVENOUS
  Filled 2024-01-06 (×2): qty 10

## 2024-01-06 NOTE — ED Provider Notes (Signed)
 [place] EMERGENCY DEPARTMENT AT [facility] Provider Note   CSN: [id number] Arrival date & time: 01/06/24  1953     History  Chief Complaint  Patient presents with   Seizures    Michelle Horn is a 23 y.o. female past medical history significant for seizures presents today for multiple seizures for approximately 10 to 15 minutes without regaining consciousness.  Patient was given 5 mg of IM Versed  which stop seizure-like activity.  Patient was switched from Keppra  to Lamictal  approximately two years ago and is compliant per EMS.  Any other HPI unable to be gathered secondary to postictal state.  Patient is responsive to painful stimuli.   Seizures      Home Medications Prior to Admission medications   Medication Sig Start Date End Date Taking? Authorizing Provider  diazePAM , 15 MG Dose, (VALTOCO  15 MG DOSE) 2 x 7.5 MG/0.1ML LQPK Spray 1 vial 7.5mg  for total of 15 mg) into each nostril at onset on convulsing, call 911 01/06/24  Yes Carie Charity, PA-C  levETIRAcetam  (KEPPRA ) 500 MG tablet Take 1 tablet (500 mg total) by mouth 2 (two) times daily. 01/06/24  Yes Akshay Spang N, PA-C  diazePAM , 15 MG Dose, (VALTOCO  15 MG DOSE) 2 x 7.5 MG/0.1ML LQPK Spray 1 vial 7.5mg  for total of 15 mg) into each nostril at onset on convulsing, call 911 11/06/23   Cassandra Cleveland, MD  lamoTRIgine  (LAMICTAL ) 100 MG tablet Take 100 mg by mouth 2 (two) times daily. Take with 150 mg for total of 250mg  twice daily    [provider]  lamoTRIgine  (LAMICTAL ) 150 MG tablet Take 150 mg by mouth 2 (two) times daily. Take with 100 mg for total of 250 mg twice daily    [provider]      Allergies    Patient has no known allergies.    Review of Systems   Review of Systems  Neurological:  Positive for seizures.    Physical Exam Updated Vital Signs BP 118/70   Pulse (!) 111   Temp 97.7 F (36.5 C) (Axillary)   Resp 19   SpO2 100%  Physical Exam Vitals and nursing note  reviewed.  Constitutional:      General: She is not in acute distress.    Appearance: Normal appearance. She is well-developed. She is not ill-appearing, toxic-appearing or diaphoretic.     Comments: Postictal  HENT:     Head: Normocephalic and atraumatic.     Right Ear: External ear normal.     Left Ear: External ear normal.     Nose: Nose normal.     Mouth/Throat:     Mouth: Mucous membranes are moist.     Pharynx: Oropharynx is clear.  Eyes:     Conjunctiva/sclera: Conjunctivae normal.  Cardiovascular:     Rate and Rhythm: Regular rhythm. Tachycardia present.     Pulses: Normal pulses.     Heart sounds: Normal heart sounds. No murmur heard. Pulmonary:     Effort: Pulmonary effort is normal. No respiratory distress.     Breath sounds: Normal breath sounds.  Abdominal:     Palpations: Abdomen is soft.     Tenderness: There is no abdominal tenderness.  Musculoskeletal:        General: No swelling.     Cervical back: Neck supple.  Skin:    General: Skin is warm and dry.     Capillary Refill: Capillary refill takes less than 2 seconds.  Neurological:  GCS: GCS eye subscore is 1. GCS verbal subscore is 2. GCS motor subscore is 5.     Comments: Upon reassessment patient is alert and oriented with a GCS of 15.  Psychiatric:        Mood and Affect: Mood normal.     ED Results / Procedures / Treatments   Labs (all labs ordered are listed, but only abnormal results are displayed) Labs Reviewed  CBC WITH DIFFERENTIAL/PLATELET - Abnormal; Notable for the following components:      Result Value   WBC 14.9 (*)    HCT 46.4 (*)    Neutro Abs 8.9 (*)    Lymphs Abs 4.5 (*)    Abs Immature Granulocytes 0.23 (*)    All other components within normal limits  COMPREHENSIVE METABOLIC PANEL WITH GFR - Abnormal; Notable for the following components:   CO2 9 (*)    Glucose, Bld 203 (*)    Creatinine, Ser 1.33 (*)    GFR, Estimated 59 (*)    Anion gap 29 (*)    All other components  within normal limits  CBG MONITORING, ED - Abnormal; Notable for the following components:   Glucose-Capillary 177 (*)    All other components within normal limits  LAMOTRIGINE  LEVEL    EKG None  Radiology No results found.  Procedures Procedures    Medications Ordered in ED Medications  levETIRAcetam  (KEPPRA ) undiluted injection 1,000 mg (has no administration in time range)    ED Course/ Medical Decision Making/ A&P                                 Medical Decision Making Amount and/or Complexity of Data Reviewed Labs: ordered.   This patient presents to the ED for concern of seizure differential diagnosis includes seizure, hypoglycemia    Additional history obtained   Additional history obtained from Electronic Medical Record External records from outside source obtained and reviewed including admission documents   Lab Tests:  I Ordered, and personally interpreted labs.  The pertinent results include: Decreased CO2 at 9, hyperglycemia 203, mildly elevated creatinine at 1.33, elevated anion gap at 29, leukocytosis 14.9 without left shift Lamotrigine  level pending   Problem List / ED Course:  Consulted neurology, Dr. Renaee Caro who recommended loading the patient with 1000 mg of Keppra  starting the patient on 500 mg Keppra  twice daily until follow-up with neurology. Considered for admission further workup however patient's vital signs, physical exam, and labs are reassuring.  Patient has had no further seizure-like activity while in ED.  Patient's diazepam  was refilled and patient prescribed Keppra  as recommended by neurology.  Patient given return precautions.  I feel patient is safe for discharge at this time.        Final Clinical Impression(s) / ED Diagnoses Final diagnoses:  Seizure [facility])    Rx / DC Orders ED Discharge Orders          Ordered    levETIRAcetam  (KEPPRA ) 500 MG tablet  2 times daily        01/06/24 2148    diazePAM , 15 MG Dose,  (VALTOCO  15 MG DOSE) 2 x 7.5 MG/0.1ML LQPK        01/06/24 2148              Carie Charity, PA-C 01/06/24 2151    Afton Horse T, DO 01/14/24 1727

## 2024-01-06 NOTE — Discharge Instructions (Addendum)
 Today you were seen for a seizure.  Please refrain from driving until you are seizure-free for 6 months.  Please pick up your medications and take as prescribed.  Please follow-up with neurology as soon as possible.  Please return to the ED if you have any further seizure-like activity.  Thank you for letting us  treat you today. After reviewing your labs, I feel you are safe to go home. Please follow up with your PCP in the next several days and provide them with your records from this visit. Return to the Emergency Room if pain becomes severe or symptoms worsen.

## 2024-01-06 NOTE — ED Triage Notes (Signed)
 Pt BIB EMS from home. EMS reports pt Pt had multiple seizures on and off again for 10-15 mins without regaining consciousness. Pt not responsive to painful stimulation, 100% RA with nasal flute   EMS reports pt was switched from kepra to Lamictal  about 2 months ago, and has been compliant since. No recent injury or illness per ems. EMS reports suctioning out saliva.   EMS admin 5mg  Versed  IM 150/90, 94% RA, HR 120, cbg 130

## 2024-01-08 LAB — LAMOTRIGINE LEVEL: Lamotrigine Lvl: 5.6 ug/mL (ref 2.0–20.0)

## 2024-01-09 ENCOUNTER — Encounter: Payer: Self-pay | Admitting: Neurology

## 2024-01-09 NOTE — Telephone Encounter (Signed)
 She has an appointment for July 31. Please add her to the cancellation list. Thanks

## 2024-01-22 NOTE — Telephone Encounter (Signed)
 Please advised them to discontinue Keppra  since she is still experiencing dizziness and nausea

## 2024-01-23 NOTE — Telephone Encounter (Signed)
Cancellation list

## 2024-02-01 ENCOUNTER — Encounter: Payer: Self-pay | Admitting: Neurology

## 2024-02-01 ENCOUNTER — Ambulatory Visit: Payer: Self-pay | Admitting: Neurology

## 2024-02-01 VITALS — BP 126/84 | Ht 62.0 in | Wt 133.5 lb

## 2024-02-01 DIAGNOSIS — G40919 Epilepsy, unspecified, intractable, without status epilepticus: Secondary | ICD-10-CM

## 2024-02-01 MED ORDER — FOLIC ACID 1 MG PO TABS: 1.0000 mg | ORAL_TABLET | Freq: Every day | ORAL | 3 refills | Status: AC

## 2024-02-01 MED ORDER — LAMOTRIGINE 25 MG PO TABS: 25.0000 mg | ORAL_TABLET | Freq: Two times a day (BID) | ORAL | 3 refills | Status: AC

## 2024-02-01 MED ORDER — LAMOTRIGINE 200 MG PO TABS: 200.0000 mg | ORAL_TABLET | Freq: Two times a day (BID) | ORAL | 3 refills | Status: AC

## 2024-02-01 NOTE — Progress Notes (Signed)
 [facility]  PATIENT: Michelle Horn DOB: [birth year]   PRIMARY NEUROLOGIST: Dr. Gregg REQUESTING CLINICIAN: Pray, Horn BRAVO, MD HISTORY FROM: Patient and mother  REASON FOR VISIT: Generalized epilepsy.    HISTORICAL  CHIEF COMPLAINT:  Chief Complaint  Patient presents with   Follow-up    Rm 13, with mom, sz follow up, last sz 5/31, medication compliant, denies SI/HI   INTERVAL HISTORY 02/01/2024 Patient presents today for follow-up, last visit was in January at that time we increased his lamotrigine  150 mg twice daily.  She did have 2 trips to the hospital in March and May for seizures.  Seizures are described as generalized convulsion.  Currently she is taking lamotrigine  225 mg twice daily denies any side effect from the medication.  With 2 breakthrough seizures, she did report medication compliance, denies any triggers. Fortunately, no injuries but she did have tongue biting and urinary incontinence.    INTERVAL HISTORY 08/24/2023:  Patient presents today for follow-up, she is accompanied by her mother.  Last visit was in August 2024.  At that time she was complaining of seizures, lamotrigine  increased to 150 mg twice daily.  She reports compliance with the medication but continued to have seizures, in September, October and November 2024, she did have breakthrough seizures despite compliance with medication.  Her last seizure was on November 25.  She denies any provoking factor, denies missing her medication.  Denies any injury from her seizures.   Update 04/03/2023 JM: Patient returns for follow-up visit after prior visit with Dr. Gregg over 1 year ago.  She is accompanied by her mother.  Reports 3 seizures this year, was seen in ED in March for typical type seizure, continue lamotrigine  100 mg twice daily.  Reports additional seizure in May, evaluated by EMS but did not proceed to ED, typical type seizure but postictal confusion lasted for about 1 hour. Reports  seizure 2 weeks ago while on cruise, typical type seizure, mild per mother.  Denies any missed dosages of lamotrigine , denies any provoked factors around the time of seizures such as illness/infection, sleep deprivation, drastic change in diet, etc. Reports tolerating lamotrigine  dosage well.   History provided for reference purposes only Update 01/26/2022 Dr. Gregg:  Patient presents today for follow-up, she is accompanied by her mother.  At last visit, plan was to start lamotrigine  and to switch patient from Keppra  to lamotrigine  due to side effect of Keppra .  She reported self discontinued the Keppra , currently is taking lamotrigine  75 mg twice daily.  She reported a breakthrough seizure on June 9 in the setting of missing her medication, she did not contact the office.  Otherwise no other complaint and no other concerns.  Consult visit 12/15/2021 Dr. Gregg: This is a 23 year old woman past medical history of migraine headaches who is presenting to establish care for her generalized epilepsy disorder.  Patient reports her first seizure happened in fall 2022.  Mom describes her seizures as patient being unresponsive, eyes glazed and followed by a full generalized convulsion with foaming at the mouth and tongue biting.  There was no urinary incontinence.  She presented to the ED after her first seizure, was stable and patient referred to pediatric neurology.  She did follow-up with pediatric neurologist, had a EEG which was normal.  She had a second seizure in December 2022 and at that time she was started on Keppra  but was not taking it consistently.  Patient report her last seizure was on October 28, 2021,  at that time she was restarted on Keppra  500 mg twice daily.  Currently she is taking 500 mg daily, denies any additional seizures but report side effect of sleepiness, and mood swing.  She denies any family history of seizure, denies any seizure risk factors. In terms of her headache she does report  right-sided headache, couple times a week which photophobia, no nausea no vomiting.  States that Tylenol  and ibuprofen  helps with her headaches.     Handedness: Right handed   Onset: 11/2020  Seizure Type: Generalized convulsion   Current frequency: Continue to have on average 1 seizure monthly or every 6 weeks. Last one November 2024  Any injuries from seizures: tongue biting   Seizure risk factors: None reported   Previous ASMs: Levetiracetam    Currenty ASMs: Lamotrigine  225 mg twice daily   ASMs side effects: Sleepy and vomiting  Brain Images: Normal MRI   Previous EEGs: Normal    OTHER MEDICAL CONDITIONS: Migraines   REVIEW OF SYSTEMS: Full 14 system review of systems performed and negative with exception of: as noted in the HPI   ALLERGIES: No Known Allergies  HOME MEDICATIONS: Outpatient Medications Prior to Visit  Medication Sig Dispense Refill   diazePAM , 15 MG Dose, (VALTOCO  15 MG DOSE) 2 x 7.5 MG/0.1ML LQPK Spray 1 vial 7.5mg  for total of 15 mg) into each nostril at onset on convulsing, call 911 2 each 3   diazePAM , 15 MG Dose, (VALTOCO  15 MG DOSE) 2 x 7.5 MG/0.1ML LQPK Spray 1 vial 7.5mg  for total of 15 mg) into each nostril at onset on convulsing, call 911 2 each 1   lamoTRIgine  (LAMICTAL ) 100 MG tablet Take 100 mg by mouth 2 (two) times daily. Take with 150 mg for total of 250mg  twice daily     lamoTRIgine  (LAMICTAL ) 150 MG tablet Take 150 mg by mouth 2 (two) times daily. Take with 100 mg for total of 250 mg twice daily     levETIRAcetam  (KEPPRA ) 500 MG tablet Take 1 tablet (500 mg total) by mouth 2 (two) times daily. 60 tablet 1   No facility-administered medications prior to visit.    PAST MEDICAL HISTORY: Past Medical History:  Diagnosis Date   COVID-19 affecting pregnancy, antepartum 08/24/2020   Epilepsy (HCC)    Group beta Strep positive 11/17/2020   Iron deficiency anemia of pregnancy 10/23/2020   Medical history non-contributory    Normal labor  11/17/2020   Supervision of normal first teen pregnancy 08/24/2020    PAST SURGICAL HISTORY: Past Surgical History:  Procedure Laterality Date   NO PAST SURGERIES      FAMILY HISTORY: Family History  Problem Relation Age of Onset   Anxiety disorder Mother    Migraines Sister    Anxiety disorder Sister    Migraines Sister    Migraines Maternal Grandmother    Anxiety disorder Maternal Grandfather     SOCIAL HISTORY: Social History   Socioeconomic History   Marital status: Single    Spouse name: Not on file   Number of children: Not on file   Years of education: Not on file   Highest education level: Not on file  Occupational History   Not on file  Tobacco Use   Smoking status: Former    Types: Cigars    Passive exposure: Never   Smokeless tobacco: Never  Vaping Use   Vaping status: Every Day   Substances: Nicotine, Flavoring  Substance and Sexual Activity   Alcohol use: Not Currently  Comment: weekly   Drug use: Yes    Types: Marijuana   Sexual activity: Yes    Birth control/protection: None  Other Topics Concern   Not on file  Social History Narrative   Right handed   Works in deli/bakery at [employer]   Caffeine- seldom   Lives at home with mom   Has a baby   Social Drivers of Corporate investment banker Strain: Not on file  Food Insecurity: No Food Insecurity (11/04/2023)   Hunger Vital Sign    Worried About Running Out of Food in the Last Year: Never true    Ran Out of Food in the Last Year: Never true  Transportation Needs: No Transportation Needs (11/04/2023)   PRAPARE - Administrator, Civil Service (Medical): No    Lack of Transportation (Non-Medical): No  Physical Activity: Not on file  Stress: Not on file  Social Connections: Not on file  Intimate Partner Violence: Not At Risk (11/04/2023)   Humiliation, Afraid, Rape, and Kick questionnaire    Fear of Current or Ex-Partner: No    Emotionally Abused: No    Physically Abused: No     Sexually Abused: No    PHYSICAL EXAM  GENERAL EXAM/CONSTITUTIONAL: Vitals:  Today's Vitals   02/01/24 1544  BP: 126/84  Weight: 133 lb 8 oz (60.6 kg)  Height: 5' 2 (1.575 m)   Body mass index is 24.42 kg/m.   Patient is in no distress; well developed, nourished and groomed; neck is supple  MUSCULOSKELETAL: Gait, strength, tone, movements noted in Neurologic exam below  NEUROLOGIC: MENTAL STATUS:      No data to display         awake, alert, oriented to person, place and time recent and remote memory intact normal attention and concentration language fluent, comprehension intact, naming intact fund of knowledge appropriate  CRANIAL NERVE:  2nd, 3rd, 4th, 6th - visual fields full to confrontation, extraocular muscles intact, no nystagmus 5th - facial sensation symmetric 7th - facial strength symmetric 8th - hearing intact 9th - palate elevates symmetrically, uvula midline 11th - shoulder shrug symmetric 12th - tongue protrusion midline  MOTOR:  normal bulk and tone, full strength in the BUE, BLE  SENSORY:  normal and symmetric to light touch  COORDINATION:  finger-nose-finger, fine finger movements normal  GAIT/STATION:  normal   DIAGNOSTIC DATA (LABS, IMAGING, TESTING) - I reviewed patient records, labs, notes, testing and imaging myself where available.  Lab Results  Component Value Date   WBC 14.9 (H) 01/06/2024   HGB 14.7 01/06/2024   HCT 46.4 (H) 01/06/2024   MCV 99.4 01/06/2024   PLT 356 01/06/2024      Component Value Date/Time   NA 136 01/06/2024 1955   NA 141 04/03/2023 0814   K 4.4 01/06/2024 1955   CL 98 01/06/2024 1955   CO2 9 (L) 01/06/2024 1955   GLUCOSE 203 (H) 01/06/2024 1955   BUN 12 01/06/2024 1955   BUN 9 04/03/2023 0814   CREATININE 1.33 (H) 01/06/2024 1955   CALCIUM 9.6 01/06/2024 1955   PROT 8.0 01/06/2024 1955   PROT 7.0 04/03/2023 0814   ALBUMIN 4.9 01/06/2024 1955   ALBUMIN 4.7 04/03/2023 0814   AST 34  01/06/2024 1955   ALT 18 01/06/2024 1955   ALKPHOS 77 01/06/2024 1955   BILITOT 0.4 01/06/2024 1955   BILITOT 0.3 04/03/2023 0814   GFRNONAA 59 (L) 01/06/2024 1955   GFRAA CANCELED 02/25/2020 9058  No results found for: CHOL, HDL, LDLCALC, LDLDIRECT, TRIG No results found for: HGBA1C No results found for: VITAMINB12 Lab Results  Component Value Date   TSH 0.833 02/25/2020    Ambulatory EEG 08/2021 This prolonged ambulatory video EEG for 45 hours is normal with no epileptiform discharges or seizure activity.  There were no transient rhythmic activities or electrographic seizures noted.  Although there were frequent muscle artifact as well as electrode artifacts noted throughout the recording particularly the second part of the recording. There were no clinical seizure activity or pushbutton events noted or reported.  Background activity was normal.  Please note that a normal EEG does not exclude epilepsy, clinical correlation is indicated.  EEG 11/05/2023: Normal    MRI Brain 12/29/2021:  Unremarkable MRI scan of the brain with and without contrast   ASSESSMENT AND PLAN  23 y.o. year old female  with migraine headache and seizure disorder who is presenting for follow up for her seizures.  Patient continue to have  breakthough seizure, 2 seizures in the past 6 months requiring admission to the hospital. She is currently on Lamotrigine  225 mg daily, will add folic acid  and refer patient to the EMU for capture and characterization. I will see her in 6 months for follow up but contact her after the completion of the EMU admission to discuss next steps.    1. Intractable epilepsy without status epilepticus, unspecified epilepsy type [facility])      Patient Instructions  Continue with lamotrigine  225 mg twice daily Refer to EMU for capture and characterization Discussed driving restriction for total of 42-months Continue follow-up PCP Return in 6 months or sooner if  worse.   Orders Placed This Encounter  Procedures   Ambulatory referral to Neurology    Meds ordered this encounter  Medications   lamoTRIgine  (LAMICTAL ) 25 MG tablet    Sig: Take 1 tablet (25 mg total) by mouth 2 (two) times daily. To take with the 200 mg tablet for a total of 225 mg twice daily.    Dispense:  180 tablet    Refill:  3   lamoTRIgine  (LAMICTAL ) 200 MG tablet    Sig: Take 1 tablet (200 mg total) by mouth 2 (two) times daily. To take with the 25 mg tablet for a total of 225 mg twice daily    Dispense:  180 tablet    Refill:  3   folic acid  (FOLVITE ) 1 MG tablet    Sig: Take 1 tablet (1 mg total) by mouth daily.    Dispense:  90 tablet    Refill:  3    Return in about 8 months (around 10/03/2024).  The patient's condition of epilepsy requires frequent monitoring and adjustments in the treatment plan, reflecting the ongoing complexity of care.  This provider is the continuing focal point for all needed services for this condition.    Pastor Falling, MD [facility] [address] [place], [place] [address] Phone [phone] Fax [phone]

## 2024-02-01 NOTE — Patient Instructions (Signed)
 Continue with lamotrigine  225 mg twice daily Refer to EMU for capture and characterization Discussed driving restriction for total of 59-months Continue follow-up PCP Return in 6 months or sooner if worse.

## 2024-02-05 ENCOUNTER — Telehealth: Payer: Self-pay | Admitting: Neurology

## 2024-02-05 NOTE — Telephone Encounter (Signed)
 Cancel referral sent to [facility].  Referral for neurology sent through [facility] to [facility]. Phone: [phone], Fax: [phone]

## 2024-02-05 NOTE — Telephone Encounter (Signed)
 Referral for neurology fax to [facility]. Phone: [phone], Fax: [phone]

## 2024-02-06 NOTE — Telephone Encounter (Signed)
 Referral for EMU was faxed to Dr Shelton at [phone]

## 2024-03-07 ENCOUNTER — Ambulatory Visit: Payer: BC Managed Care – PPO | Admitting: Neurology

## 2024-03-17 ENCOUNTER — Emergency Department: Admission: EM | Admit: 2024-03-17 | Discharge: 2024-03-17 | Disposition: A | Discharge status: Home / Self Care

## 2024-03-17 ENCOUNTER — Other Ambulatory Visit: Payer: Self-pay

## 2024-03-17 DIAGNOSIS — R569 Unspecified convulsions: Secondary | ICD-10-CM | POA: Diagnosis not present

## 2024-03-17 DIAGNOSIS — R9431 Abnormal electrocardiogram [ECG] [EKG]: Secondary | ICD-10-CM | POA: Diagnosis not present

## 2024-03-17 DIAGNOSIS — R1111 Vomiting without nausea: Secondary | ICD-10-CM | POA: Diagnosis not present

## 2024-03-17 DIAGNOSIS — R110 Nausea: Secondary | ICD-10-CM | POA: Diagnosis not present

## 2024-03-17 DIAGNOSIS — G4489 Other headache syndrome: Secondary | ICD-10-CM | POA: Diagnosis not present

## 2024-03-17 LAB — BASIC METABOLIC PANEL WITH GFR
Anion gap: 19 — ABNORMAL HIGH (ref 5–15)
BUN: 13 mg/dL (ref 6–20)
CO2: 13 mmol/L — ABNORMAL LOW (ref 22–32)
Calcium: 9.4 mg/dL (ref 8.9–10.3)
Chloride: 105 mmol/L (ref 98–111)
Creatinine, Ser: 1.18 mg/dL — ABNORMAL HIGH (ref 0.44–1.00)
GFR, Estimated: >60 mL/min (ref >60)
Glucose, Bld: 97 mg/dL (ref 70–99)
Potassium: 4 mmol/L (ref 3.5–5.1)
Sodium: 137 mmol/L (ref 135–145)

## 2024-03-17 LAB — CBC WITH DIFFERENTIAL/PLATELET
Abs Immature Granulocytes: 0.11 K/uL — ABNORMAL HIGH (ref 0.00–0.07)
Basophils Absolute: 0.1 K/uL (ref 0.0–0.1)
Basophils Relative: 1 %
Eosinophils Absolute: 0.1 K/uL (ref 0.0–0.5)
Eosinophils Relative: 1 %
HCT: 39.1 % (ref 36.0–46.0)
Hemoglobin: 12.9 g/dL (ref 12.0–15.0)
Immature Granulocytes: 1 %
Lymphocytes Relative: 25 %
Lymphs Abs: 2.2 K/uL (ref 0.7–4.0)
MCH: 31.3 pg (ref 26.0–34.0)
MCHC: 33 g/dL (ref 30.0–36.0)
MCV: 94.9 fL (ref 80.0–100.0)
Monocytes Absolute: 0.6 K/uL (ref 0.1–1.0)
Monocytes Relative: 7 %
Neutro Abs: 5.7 K/uL (ref 1.7–7.7)
Neutrophils Relative %: 65 %
Platelets: 290 K/uL (ref 150–400)
RBC: 4.12 MIL/uL (ref 3.87–5.11)
RDW: 12.9 % (ref 11.5–15.5)
WBC: 8.7 K/uL (ref 4.0–10.5)
nRBC: 0 % (ref 0.0–0.2)

## 2024-03-17 LAB — URINALYSIS, COMPLETE (UACMP) WITH MICROSCOPIC
Bilirubin Urine: NEGATIVE
Glucose, UA: NEGATIVE mg/dL
Ketones, ur: NEGATIVE mg/dL
Leukocytes,Ua: NEGATIVE
Nitrite: NEGATIVE
Protein, ur: 100 mg/dL — AB
Specific Gravity, Urine: 1.012 (ref 1.005–1.030)
pH: 5 (ref 5.0–8.0)

## 2024-03-17 LAB — PREGNANCY, URINE: Preg Test, Ur: NEGATIVE

## 2024-03-17 LAB — HCG, QUANTITATIVE, PREGNANCY: hCG, Beta Chain, Quant, S: <1 m[IU]/mL (ref <5)

## 2024-03-17 MED ORDER — LORAZEPAM 2 MG/ML IJ SOLN
INTRAMUSCULAR | Status: AC
  Filled 2024-03-17: qty 2

## 2024-03-17 MED ORDER — SODIUM CHLORIDE 0.9 % IV BOLUS
1000.0000 mL | Freq: Once | INTRAVENOUS | Status: AC
  Administered 2024-03-17: 1000 mL via INTRAVENOUS

## 2024-03-17 MED ORDER — LACOSAMIDE 50 MG PO TABS: 50.0000 mg | ORAL_TABLET | Freq: Two times a day (BID) | ORAL | 0 refills | Status: DC

## 2024-03-17 MED ORDER — LORAZEPAM 2 MG/ML IJ SOLN: 2.0000 mg | Freq: Once | INTRAMUSCULAR | Status: DC

## 2024-03-17 MED ORDER — LEVETIRACETAM (KEPPRA) 500 MG/5 ML ADULT IV PUSH: 1500.0000 mg | Freq: Once | INTRAVENOUS | Status: AC

## 2024-03-17 MED ORDER — SODIUM CHLORIDE 0.9 % IV SOLN
200.0000 mg | Freq: Once | INTRAVENOUS | Status: AC
  Administered 2024-03-17: 200 mg via INTRAVENOUS
  Filled 2024-03-17: qty 20

## 2024-03-17 MED ORDER — LEVETIRACETAM 500 MG/5ML IV SOLN
INTRAVENOUS | Status: AC
  Administered 2024-03-17: 1500 mg via INTRAVENOUS
  Filled 2024-03-17: qty 15

## 2024-03-17 NOTE — Discharge Instructions (Addendum)
 Your evaluation in the emergency department was overall reassuring.  You did have a second seizure while here, and I discussed your case with the neurologist who recommends starting you on an additional seizure medication (lacosamide ).  This medication is twice daily, and you should continue with your usual seizure medication in addition.  Please do follow-up closely with your neurologist and primary care provider for reevaluation and ongoing management, and return to the emergency department with any new or worsening symptoms.

## 2024-03-17 NOTE — ED Provider Notes (Signed)
 [facility] Provider Note    Event Date/Time   First MD Initiated Contact with Patient 03/17/24 1636     (approximate)   History   Seizures  Pt brought in by EMS from work for seizure. Pt states she was recently taken off her keppra  by MD, but has been compliant with her lamictal . Per bystanders, seizure lasted approximately 2 minutes. Upon arrival to ED, pt alert and oriented x 4; however speech is slightly slurred and responses are delayed.    HPI Michelle Horn is a 23 y.o. female PMH cluster seizures, marijuana use, depression presents for evaluation of reported seizure - Patient was at work, had a generalized tonic-clonic 2-minute seizure about 1.5 hours prior to my evaluation.  Coworkers caught her, no head strike.  Has otherwise been in her usual state of health with no recent infectious symptoms.  Did take her Lamictal  as already prescribed today.  Notes her Keppra  was discontinued about 3 weeks ago because of medication side effects. - No complaints currently, feels well - Collateral gathered from patient's mother bedside.  Notes that she does have seizures every few months, does have a history of having recurrent seizures and short time frames.  Confirms patient is back to her mental status baseline.   Per chart review, patient was admitted in March of this year for cluster seizure.  Evaluated by neurology, increased her home lamotrigine  dose.  Last seen by her neurologist on 02/01/2024 per my chart review.  The patient continues to have breakthrough seizures despite being adherent to her lamotrigine  regimen (225 mg daily), added folic acid  and referred patient for further diagnostic testing.  Appears her Keppra  was discontinued on 01/22/2024 due to reported dizziness and nausea side effects.      Physical Exam   Triage Vital Signs: ED Triage Vitals  Encounter Vitals Group     BP --      Girls Systolic BP Percentile --      Girls Diastolic BP  Percentile --      Boys Systolic BP Percentile --      Boys Diastolic BP Percentile --      Pulse Rate 03/17/24 1635 100     Resp 03/17/24 1635 (!) 21     Temp 03/17/24 1635 98.2 F (36.8 C)     Temp Source 03/17/24 1635 Oral     SpO2 03/17/24 1635 100 %     Weight 03/17/24 1636 148 lb 6.4 oz (67.3 kg)     Height 03/17/24 1636 5' 2 (1.575 m)     Head Circumference --      Peak Flow --      Pain Score 03/17/24 1636 0     Pain Loc --      Pain Education --      Exclude from Growth Chart --     Most recent vital signs: Vitals:   03/17/24 2130 03/17/24 2145  BP:    Pulse:    Resp: 12 18  Temp:    SpO2:       General: Awake, no distress.  HEENT: Normocephalic, atraumatic, no tongue lacerations CV:  Good peripheral perfusion. RRR, RP 2+ Resp:  Normal effort. CTAB Abd:  No distention. Nontender to deep palpation throughout Neuro:  Aox4, CN II-XII intact, FNF wnl, finger taps fast b/l, 5/5 strength in bilateral finger extension/grip, arm flexion/extension, EHL/FHL. BUE AG 10+ sec no drift, BLE AG 5+ sec no drift. Ambulates with steady gait. SILT. Negative Rhomberg.  ED Results / Procedures / Treatments   Labs (all labs ordered are listed, but only abnormal results are displayed) Labs Reviewed  CBC WITH DIFFERENTIAL/PLATELET - Abnormal; Notable for the following components:      Result Value   Abs Immature Granulocytes 0.11 (*)    All other components within normal limits  URINALYSIS, COMPLETE (UACMP) WITH MICROSCOPIC - Abnormal; Notable for the following components:   Color, Urine STRAW (*)    APPearance HAZY (*)    Hgb urine dipstick MODERATE (*)    Protein, ur 100 (*)    Bacteria, UA RARE (*)    All other components within normal limits  BASIC METABOLIC PANEL WITH GFR - Abnormal; Notable for the following components:   CO2 13 (*)    Creatinine, Ser 1.18 (*)    Anion gap 19 (*)    All other components within normal limits  HCG, QUANTITATIVE, PREGNANCY   PREGNANCY, URINE  LAMOTRIGINE  LEVEL     EKG  Ecg = sinus rhythm, rate 98, no gross ST elevation or depression, no significant repolarization abnormality, normal axis, normal intervals.  No evidence of ischemia nor arrhythmia on my interpretation.   RADIOLOGY N/a    PROCEDURES:  Critical Care performed: Yes, see critical care procedure note(s)  Procedures   MEDICATIONS ORDERED IN ED: Medications  levETIRAcetam  (KEPPRA ) undiluted injection 1,500 mg (1,500 mg Intravenous Given 03/17/24 1746)  sodium chloride  0.9 % bolus 1,000 mL (0 mLs Intravenous Stopped 03/17/24 2224)  lacosamide  (VIMPAT ) 200 mg in sodium chloride  0.9 % 25 mL IVPB (0 mg Intravenous Stopped 03/17/24 2224)     IMPRESSION / MDM / ASSESSMENT AND PLAN / ED COURSE  I reviewed the triage vital signs and the nursing notes.                              DDX/MDM/AP: Differential diagnosis includes, but is not limited to, breakthrough seizure in patient with known history of cluster seizures and breakthrough seizures despite medication adherence, consider underlying electrolyte abnormality.  No history to suggest underlying infection.  No evidence of traumatic head and lower extremity injury from seizures.  Plan: - Labs  -EKG - No indication for head imaging at this time, last imaging was CT head on 10/27/2023, unremarkable.  Has also had prior MRI.  Patient's presentation is most consistent with acute presentation with potential threat to life or bodily function.  The patient is on the cardiac monitor to evaluate for evidence of arrhythmia and/or significant heart rate changes.  ED course below.  Shortly after my evaluation, patient had a generalized tonic tonic-clonic 1 minute seizure.  Mother notes that patient frequently gets seizures in the short duration, especially if her initial seizure is short (Notes 2 minutes was short for her initial seizure).  Seizure aborted prior to administration of any  benzodiazepines, loaded with 1.5 g Keppra .  Discussed with neurology, recommended loading Vimpat  and starting p.o.  If returns to baseline mental status with no further seizures, stable for discharge.  Over the next few hours patient did return to baseline mental state, no recurrence of seizures.  Amenable to outpatient follow-up with her neurologist and primary care provider.  Will continue her lamotrigine  and start lacosamide , prescription provided.  ED return precautions in place.  Patient and family agree with plan.  Clinical Course as of 03/17/24 2239  Sun Mar 17, 2024  1719 CBC reviewed, unremarkable [MM]  1738 Patient had another  generalized tonic-clonic seizure, lasting about 1 minute, self aborted.  Loading with Keppra . [MM]  1748 Hcg neg UA w/ no e/o infxn [MM]  1910 BMP reviewed, overall unremarkable other than low bicarb appears chronically low for patient, will give 1 L IV fluid [MM]  1911 Patient reevaluated, resting comfortably in bed.  Does remain somewhat sleepy but easily arousable.  Does respond to questions appropriately but is confused on simple things such as year, remains somewhat postictal.  Heart rate about 100.  Neurology paged to discuss [MM]  1940 D/w Dr. Lindzen Rec start vimpat  50mg  bid Load with 200mg  iv vimpat  now Do not reinitiate Keppra  given her prior side effects If does not return to baseline mental state while here in emergency department, would recommend admission for observation, no indication for transfer, could do spot EEG here and then continue with plan for EMU as outpatient  [MM]  2233 Patient reevaluated, has returned to baseline mental state, no longer confused, AO x 4.  Mother also confirms she is now acting like herself.  Discussed plan to continue her regular lamotrigine  (level still pending, can follow as outpatient) and start Vimpat .  Was loaded with Vimpat  here.  Plan for outpatient neurology follow-up.  ED return precautions in place.  Patient  and mother agree with plan. [MM]    Clinical Course User Index [MM] Clarine Ozell LABOR, MD     FINAL CLINICAL IMPRESSION(S) / ED DIAGNOSES   Final diagnoses:  Seizure (HCC)     Rx / DC Orders   ED Discharge Orders          Ordered    lacosamide  (VIMPAT ) 50 MG TABS tablet  2 times daily        03/17/24 2235             Note:  This document was prepared using Dragon voice recognition software and may include unintentional dictation errors.   Clarine Ozell LABOR, MD 03/17/24 2239

## 2024-03-17 NOTE — ED Triage Notes (Signed)
 Pt brought in by EMS from work for seizure. Pt states she was recently taken off her keppra  by MD, but has been compliant with her lamictal . Per bystanders, seizure lasted approximately 2 minutes. Upon arrival to ED, pt alert and oriented x 4; however speech is slightly slurred and responses are delayed.

## 2024-03-18 ENCOUNTER — Inpatient Hospital Stay (HOSPITAL_COMMUNITY): Admission: RE | Admit: 2024-03-18 | Source: Ambulatory Visit | Admitting: Neurology

## 2024-03-19 LAB — LAMOTRIGINE LEVEL: Lamotrigine Lvl: 7.5 ug/mL (ref 2.0–20.0)

## 2024-04-13 ENCOUNTER — Encounter: Payer: Self-pay | Admitting: Neurology

## 2024-04-15 MED ORDER — LACOSAMIDE 50 MG PO TABS: 50.0000 mg | ORAL_TABLET | Freq: Two times a day (BID) | ORAL | 5 refills | Status: AC

## 2024-04-15 NOTE — Telephone Encounter (Signed)
 Requested Prescriptions   Pending Prescriptions Disp Refills   lacosamide  (VIMPAT ) 50 MG TABS tablet 60 tablet 0    Sig: Take 1 tablet (50 mg total) by mouth 2 (two) times daily.   This medication was sent from hospital. Per pts request, routing to provider to refill should he deem necessary.  Last seen 01/12/24 Next appt 10/03/24  Dispenses   Dispensed Days Supply Quantity Provider Pharmacy  LACOSAMIDE  50 MG TABLET 03/17/2024 30 60 each Clarine Ozell LABOR, MD [facility] [phone] - [email or web address]

## 2024-08-15 ENCOUNTER — Ambulatory Visit

## 2024-08-15 VITALS — BP 124/87 | HR 104 | Temp 98.7°F | Ht 62.0 in | Wt 136.0 lb

## 2024-08-15 DIAGNOSIS — J101 Influenza due to other identified influenza virus with other respiratory manifestations: Secondary | ICD-10-CM

## 2024-08-15 DIAGNOSIS — R0981 Nasal congestion: Secondary | ICD-10-CM | POA: Diagnosis not present

## 2024-08-15 LAB — POC SOFIA 2 FLU + SARS ANTIGEN FIA
Influenza A, POC: POSITIVE — AB
Influenza B, POC: NEGATIVE
SARS Coronavirus 2 Ag: NEGATIVE

## 2024-08-15 MED ORDER — OSELTAMIVIR PHOSPHATE 75 MG PO CAPS: 75.0000 mg | ORAL_CAPSULE | Freq: Two times a day (BID) | ORAL | 0 refills | Status: AC

## 2024-08-15 MED ORDER — ONDANSETRON 4 MG PO TBDP: 4.0000 mg | ORAL_TABLET | Freq: Three times a day (TID) | ORAL | 0 refills | Status: AC | PRN

## 2024-08-15 NOTE — Progress Notes (Signed)
" ° ° °  SUBJECTIVE:   CHIEF COMPLAINT / HPI:   Flu Symptoms - started 2 days ago - runny nose, cough, congestion, chest pain, chills - Post tussive emesis, mucus, subjective fever - Drinking but poor appetite - Urine output remains same - Denies diarrhea, blood in vomit - Using tylenol , zantac, and aleve without significant improvement.   PERTINENT  PMH / PSH: seizures  OBJECTIVE:   BP 124/87   Pulse (!) 104   Temp 98.7 F (37.1 C) (Oral)   Ht 5' 2 (1.575 m)   Wt 136 lb (61.7 kg)   SpO2 99%   BMI 24.87 kg/m   Physical Exam General: Alert,  No acute distress.  HEENT: PERRL. EOMI. MMM. Nasal drainage. Mild swelling of right lateral eyelid Cardiovascular: RRR Respiratory: Lungs CTAB. Normal work of breathing. Skin: No rashes. No icterus.  Neurologic: No focal deficits. Moving all extremities. Psychiatric: Cooperative. Appropriate mood. Appropriate affect.   ASSESSMENT/PLAN:   Assessment & Plan Nasal congestion Positive for Flu A. Discussed tamiflu , pt is interested and will send rx along with zofran . Supportive care and return precautions discussed.  Influenza A As above     Milda LITTIE Deed, MD [facility] "

## 2024-08-15 NOTE — Patient Instructions (Addendum)
 It was good to see you today.   Please bring ALL of your medications with you to every visit.    Today we talked about: Flu  Continue supportive care with tylenol  and ibuprofen  Can try nasal saline spray for congestion and vicks vapor rub Drink sips throughout the day, small bland meals if possible.     Thank you for choosing [facility]. Please refer to your mychart for specifics regarding today's visit or future appointments.

## 2024-10-03 ENCOUNTER — Ambulatory Visit: Admitting: Neurology
